# Patient Record
Sex: Female | Born: 1985
Health system: Southern US, Community
[De-identification: ages and names within clinical notes are randomized; demographics above are authoritative.]

## PROBLEM LIST (undated history)

## (undated) ENCOUNTER — Inpatient Hospital Stay (HOSPITAL_COMMUNITY): Payer: Self-pay

## (undated) DIAGNOSIS — D649 Anemia, unspecified: Secondary | ICD-10-CM

## (undated) DIAGNOSIS — O3430 Maternal care for cervical incompetence, unspecified trimester: Secondary | ICD-10-CM

## (undated) HISTORY — PX: NO PAST SURGERIES: SHX2092

---

## 2019-11-17 ENCOUNTER — Other Ambulatory Visit: Payer: Self-pay | Admitting: Family Medicine

## 2019-11-17 ENCOUNTER — Other Ambulatory Visit (HOSPITAL_COMMUNITY)
Admission: RE | Admit: 2019-11-17 | Discharge: 2019-11-17 | Disposition: A | Discharge status: Home / Self Care | Payer: Commercial Managed Care - PPO | Source: Ambulatory Visit | Attending: Family Medicine | Admitting: Family Medicine

## 2019-11-17 DIAGNOSIS — Z0000 Encounter for general adult medical examination without abnormal findings: Secondary | ICD-10-CM | POA: Insufficient documentation

## 2020-08-24 NOTE — L&D Delivery Note (Signed)
Delivery Note At 3:17 PM a 21 weeks 3 days non-viable female was delivered via Vaginal, Spontaneous (Presentation:   Occiput Transverse).  APGAR: 0, 0; weight  .   Placenta status: Spontaneous;Pathology, Intact.  Cord: 3 vessels with the following complications: None.  Cord pH: NA  Anesthesia: Epidural Episiotomy: None Lacerations: None Suture Repair:  NA Est. Blood Loss (mL):  100 MmL  Mom to postpartum.  Baby to [place].  Maria Murray 02/18/2021, 6:11 PM

## 2021-01-01 ENCOUNTER — Other Ambulatory Visit: Payer: Self-pay

## 2021-01-01 ENCOUNTER — Encounter (HOSPITAL_COMMUNITY): Payer: Self-pay | Admitting: Obstetrics and Gynecology

## 2021-01-01 ENCOUNTER — Inpatient Hospital Stay (HOSPITAL_COMMUNITY)
Admission: AD | Admit: 2021-01-01 | Discharge: 2021-01-01 | Disposition: A | Discharge status: Home / Self Care | Payer: Managed Care, Other (non HMO) | Attending: Obstetrics and Gynecology | Admitting: Obstetrics and Gynecology

## 2021-01-01 ENCOUNTER — Inpatient Hospital Stay (HOSPITAL_BASED_OUTPATIENT_CLINIC_OR_DEPARTMENT_OTHER): Payer: Managed Care, Other (non HMO)

## 2021-01-01 DIAGNOSIS — O3412 Maternal care for benign tumor of corpus uteri, second trimester: Secondary | ICD-10-CM | POA: Diagnosis not present

## 2021-01-01 DIAGNOSIS — O26892 Other specified pregnancy related conditions, second trimester: Secondary | ICD-10-CM | POA: Insufficient documentation

## 2021-01-01 DIAGNOSIS — O209 Hemorrhage in early pregnancy, unspecified: Secondary | ICD-10-CM | POA: Insufficient documentation

## 2021-01-01 DIAGNOSIS — O4692 Antepartum hemorrhage, unspecified, second trimester: Secondary | ICD-10-CM

## 2021-01-01 DIAGNOSIS — R1012 Left upper quadrant pain: Secondary | ICD-10-CM | POA: Insufficient documentation

## 2021-01-01 DIAGNOSIS — D251 Intramural leiomyoma of uterus: Secondary | ICD-10-CM | POA: Diagnosis not present

## 2021-01-01 DIAGNOSIS — Z3A14 14 weeks gestation of pregnancy: Secondary | ICD-10-CM | POA: Diagnosis not present

## 2021-01-01 DIAGNOSIS — Z79899 Other long term (current) drug therapy: Secondary | ICD-10-CM | POA: Insufficient documentation

## 2021-01-01 DIAGNOSIS — R0781 Pleurodynia: Secondary | ICD-10-CM | POA: Insufficient documentation

## 2021-01-01 DIAGNOSIS — O418X20 Other specified disorders of amniotic fluid and membranes, second trimester, not applicable or unspecified: Secondary | ICD-10-CM | POA: Insufficient documentation

## 2021-01-01 HISTORY — DX: Anemia, unspecified: D64.9

## 2021-01-01 NOTE — MAU Note (Signed)
Went to [place] around 0215 and had bright blood on tissue when I wiped. No pain now. Had some pain in LUQ last night but went away. Did exercise yesterday and walked. No recent intercourse

## 2021-01-01 NOTE — Discharge Instructions (Signed)
Subchorionic Hematoma  A hematoma is a collection of blood outside of the blood vessels. A subchorionic hematoma is a collection of blood between the outer wall of the embryo (chorion) and the inner wall of the uterus. This condition can cause vaginal bleeding. Early small hematomas usually shrink on their own and do not affect your baby or pregnancy. When bleeding starts later in pregnancy, or if the hematoma is larger or occurs in older pregnant women, the condition may be more serious. Larger hematomas increase the chances of miscarriage. This condition also increases the risk of:  Premature separation of the placenta from the uterus.  Premature (preterm) labor.  Stillbirth. What are the causes? The exact cause of this condition is not known. It occurs when blood is trapped between the placenta and the uterine wall because the placenta has separated from the original site of implantation. What increases the risk? You are more likely to develop this condition if:  You were treated with fertility medicines.  You became pregnant through in vitro fertilization (IVF). What are the signs or symptoms? Symptoms of this condition include:  Vaginal spotting or bleeding.  Abdominal pain. This is rare. Sometimes you may have no symptoms and the bleeding may only be seen when ultrasound images are taken (transvaginal ultrasound). How is this diagnosed? This condition is diagnosed based on a physical exam. This includes a pelvic exam. You may also have other tests, including:  Blood tests.  Urine tests.  Ultrasound of the abdomen. How is this treated? Treatment for this condition can vary. Treatment may include:  Watchful waiting. You will be monitored closely for any changes in bleeding.  Medicines.  Activity restriction. This may be needed until the bleeding stops.  A medicine called Rh immunoglobulin. This is given if you have an Rh-negative blood type. It prevents Rh  sensitization. Follow these instructions at home:  Stay on bed rest if told to do so by your health care provider.  Do not lift anything that is heavier than 10 lb (4.5 kg), or the limit that you are told by your health care provider.  Track and write down the number of pads you use each day and how soaked (saturated) they are.  Do not use tampons.  Keep all follow-up visits. This is important. Your health care provider may ask you to have follow-up blood tests or ultrasound tests or both. Contact a health care provider if:  You have any vaginal bleeding.  You have a fever. Get help right away if:  You have severe cramps in your stomach, back, abdomen, or pelvis.  You pass large clots or tissue. Save any tissue for your health care provider to look at.  You faint.  You become light-headed or weak. Summary  A subchorionic hematoma is a collection of blood between the outer wall of the embryo (chorion) and the inner wall of the uterus.  This condition can cause vaginal bleeding.  Sometimes you may have no symptoms and the bleeding may only be seen when ultrasound images are taken.  Treatment may include watchful waiting, medicines, or activity restriction.  Keep all follow-up visits. Get help right away if you have severe cramps or heavy vaginal bleeding. This information is not intended to replace advice given to you by your health care provider. Make sure you discuss any questions you have with your health care provider. Document Revised: 05/06/2020 Document Reviewed: 05/06/2020 Elsevier Patient Education  2021 [employer].

## 2021-01-01 NOTE — MAU Provider Note (Addendum)
Chief Complaint: Vaginal Bleeding   Event Date/Time   First Provider Initiated Contact with Patient 01/01/21 0339        SUBJECTIVE HPI: Maria Murray is a 35 y.o. G1P0 at [redacted]w[redacted]d by LMP who presents to maternity admissions reporting vaginal bleeding at [place].  No pain associated with this other than a transient pain in LUQ near rib. . She denies vaginal itching/burning, urinary symptoms, h/a, dizziness, n/v, or fever/chills.    Vaginal Bleeding The patient's primary symptoms include vaginal bleeding. The patient's pertinent negatives include no genital itching, genital lesions, genital odor or pelvic pain. This is a new problem. The current episode started today. The problem has been unchanged. The patient is experiencing no pain. She is pregnant. Pertinent negatives include no abdominal pain, back pain, chills, constipation, diarrhea, dysuria, fever, frequency, headaches, nausea or vomiting. The vaginal discharge was bloody. The vaginal bleeding is lighter than menses. She has not been passing clots. She has not been passing tissue. Nothing aggravates the symptoms. She has tried nothing for the symptoms.   RN Note: Went to [facility] around 0215 and had bright blood on tissue when I wiped. No pain now. Had some pain in LUQ last night but went away. Did exercise yesterday and walked. No recent intercourse  Past Medical History:  Diagnosis Date  . Anemia    History reviewed. No pertinent surgical history. Social History   Socioeconomic History  . Marital status: Married    Spouse name: Not on file  . Number of children: Not on file  . Years of education: Not on file  . Highest education level: Not on file  Occupational History  . Not on file  Tobacco Use  . Smoking status: Never Smoker  . Smokeless tobacco: Never Used  Vaping Use  . Vaping Use: Never used  Substance and Sexual Activity  . Alcohol use: Not Currently  . Drug use: Never  . Sexual activity: Yes  Other Topics Concern   . Not on file  Social History Narrative  . Not on file   Social Determinants of Health   Financial Resource Strain: Not on file  Food Insecurity: Not on file  Transportation Needs: Not on file  Physical Activity: Not on file  Stress: Not on file  Social Connections: Not on file  Intimate Partner Violence: Not on file   No current facility-administered medications on file prior to encounter.   Current Outpatient Medications on File Prior to Encounter  Medication Sig Dispense Refill  . cholecalciferol (VITAMIN D3) 25 MCG (1000 UNIT) tablet Take 1,000 Units by mouth daily.    . Prenatal Vit-Fe Fumarate-FA (PRENATAL MULTIVITAMIN) TABS tablet Take 1 tablet by mouth daily at 12 noon.     No Known Allergies  I have reviewed patient's Past Medical Hx, Surgical Hx, Family Hx, Social Hx, medications and allergies.   ROS:  Review of Systems  Constitutional: Negative for chills and fever.  Gastrointestinal: Negative for abdominal pain, constipation, diarrhea, nausea and vomiting.  Genitourinary: Positive for vaginal bleeding. Negative for dysuria, frequency and pelvic pain.  Musculoskeletal: Negative for back pain.  Neurological: Negative for headaches.   Review of Systems  Other systems negative   Physical Exam  Physical Exam Patient Vitals for the past 24 hrs:  BP Temp Pulse Resp SpO2 Height Weight  01/01/21 0316 111/64 -- 68 -- 100 % -- --  01/01/21 0315 -- 98 F (36.7 C) -- 18 -- 5\' 7"  (1.702 m) 74.8 kg   Constitutional:  Well-developed, well-nourished female in no acute distress.  Cardiovascular: normal rate Respiratory: normal effort GI: Abd soft, non-tender. Pos BS x 4 MS: Extremities nontender, no edema, normal ROM Neurologic: Alert and oriented x 4.  GU: Neg CVAT.  PELVIC EXAM: Cervix pink, visually closed, without lesion, small dark red somewhat clotted bloody discharge, vaginal walls and external genitalia normal  FHT 147 by doppler  LAB RESULTS Blood Type is  O+    IMAGING Posterior placenta ? Small subchorionic hemorrhage noted 1 myoma, 2cm  MAU Management/MDM: Ordered Ultrasound to investigate source of bleeding US showed one small ? [facility]  Reviewed findings with patient in depth Pictures shown.  Reassured [facility] is not statistically associated with miscarriage Recommend pelvic rest for a couple of weeks  ASSESSMENT Single iUP at [redacted]w[redacted]d Second trimester bleeding ? Small [place]  PLAN Discharge home Pelvic rest Bleeding precautions Follow up in office Pt stable at time of discharge. Encouraged to return here if she develops worsening of symptoms, increase in pain, fever, or other concerning symptoms.    Hansel Feinstein CNM, MSN Certified Nurse-Midwife 01/01/2021  3:39 AM

## 2021-01-01 NOTE — Progress Notes (Addendum)
Written and verbal d/c instructions given and understanding voiced. Showed pt how to sign up for MyChart using the code on her d/c papers.

## 2021-02-10 ENCOUNTER — Inpatient Hospital Stay (HOSPITAL_COMMUNITY)
Admission: AD | Admit: 2021-02-10 | Discharge: 2021-02-19 | DRG: 805 | Disposition: A | Discharge status: Home / Self Care | Payer: Managed Care, Other (non HMO) | Attending: Obstetrics and Gynecology | Admitting: Obstetrics and Gynecology

## 2021-02-10 ENCOUNTER — Encounter (HOSPITAL_COMMUNITY): Payer: Self-pay | Admitting: Obstetrics and Gynecology

## 2021-02-10 ENCOUNTER — Other Ambulatory Visit: Payer: Self-pay

## 2021-02-10 DIAGNOSIS — O3432 Maternal care for cervical incompetence, second trimester: Secondary | ICD-10-CM | POA: Diagnosis present

## 2021-02-10 DIAGNOSIS — Z3A20 20 weeks gestation of pregnancy: Secondary | ICD-10-CM | POA: Diagnosis not present

## 2021-02-10 DIAGNOSIS — Z3A21 21 weeks gestation of pregnancy: Secondary | ICD-10-CM | POA: Diagnosis not present

## 2021-02-10 DIAGNOSIS — Z20822 Contact with and (suspected) exposure to covid-19: Secondary | ICD-10-CM | POA: Diagnosis present

## 2021-02-10 DIAGNOSIS — O42912 Preterm premature rupture of membranes, unspecified as to length of time between rupture and onset of labor, second trimester: Secondary | ICD-10-CM | POA: Diagnosis not present

## 2021-02-10 DIAGNOSIS — O321XX0 Maternal care for breech presentation, not applicable or unspecified: Secondary | ICD-10-CM | POA: Diagnosis not present

## 2021-02-10 DIAGNOSIS — O209 Hemorrhage in early pregnancy, unspecified: Secondary | ICD-10-CM

## 2021-02-10 DIAGNOSIS — O3433 Maternal care for cervical incompetence, third trimester: Secondary | ICD-10-CM | POA: Diagnosis present

## 2021-02-10 DIAGNOSIS — O09522 Supervision of elderly multigravida, second trimester: Secondary | ICD-10-CM | POA: Diagnosis not present

## 2021-02-10 DIAGNOSIS — N883 Incompetence of cervix uteri: Secondary | ICD-10-CM | POA: Diagnosis present

## 2021-02-10 DIAGNOSIS — O26892 Other specified pregnancy related conditions, second trimester: Secondary | ICD-10-CM | POA: Diagnosis not present

## 2021-02-10 DIAGNOSIS — O4692 Antepartum hemorrhage, unspecified, second trimester: Secondary | ICD-10-CM | POA: Diagnosis not present

## 2021-02-10 DIAGNOSIS — O3442 Maternal care for other abnormalities of cervix, second trimester: Secondary | ICD-10-CM | POA: Diagnosis not present

## 2021-02-10 DIAGNOSIS — Z3492 Encounter for supervision of normal pregnancy, unspecified, second trimester: Secondary | ICD-10-CM

## 2021-02-10 DIAGNOSIS — O4290 Premature rupture of membranes, unspecified as to length of time between rupture and onset of labor, unspecified weeks of gestation: Secondary | ICD-10-CM

## 2021-02-10 DIAGNOSIS — N898 Other specified noninflammatory disorders of vagina: Secondary | ICD-10-CM | POA: Diagnosis not present

## 2021-02-10 DIAGNOSIS — O3431 Maternal care for cervical incompetence, first trimester: Secondary | ICD-10-CM | POA: Diagnosis present

## 2021-02-10 LAB — CBC
HCT: 39 % (ref 36.0–46.0)
Hemoglobin: 13.1 g/dL (ref 12.0–15.0)
MCH: 29.6 pg (ref 26.0–34.0)
MCHC: 33.6 g/dL (ref 30.0–36.0)
MCV: 88.2 fL (ref 80.0–100.0)
Platelets: 204 10*3/uL (ref 150–400)
RBC: 4.42 MIL/uL (ref 3.87–5.11)
RDW: 13.1 % (ref 11.5–15.5)
WBC: 12.9 10*3/uL — ABNORMAL HIGH (ref 4.0–10.5)
nRBC: 0 % (ref 0.0–0.2)

## 2021-02-10 LAB — TYPE AND SCREEN
ABO/RH(D): O POS
Antibody Screen: NEGATIVE

## 2021-02-10 LAB — SARS CORONAVIRUS 2 (TAT 6-24 HRS): SARS Coronavirus 2: NEGATIVE

## 2021-02-10 LAB — URINALYSIS, ROUTINE W REFLEX MICROSCOPIC
Bilirubin Urine: NEGATIVE
Glucose, UA: NEGATIVE mg/dL
Ketones, ur: 20 mg/dL — AB
Nitrite: NEGATIVE
Protein, ur: NEGATIVE mg/dL
Specific Gravity, Urine: 1.003 — ABNORMAL LOW (ref 1.005–1.030)
pH: 7 (ref 5.0–8.0)

## 2021-02-10 MED ORDER — SODIUM CHLORIDE 0.9% FLUSH: 3.0000 mL | INTRAVENOUS | Status: DC | PRN

## 2021-02-10 MED ORDER — SODIUM CHLORIDE 0.9% FLUSH
3.0000 mL | Freq: Two times a day (BID) | INTRAVENOUS | Status: DC
  Administered 2021-02-10 – 2021-02-17 (×13): 3 mL via INTRAVENOUS

## 2021-02-10 MED ORDER — PRENATAL MULTIVITAMIN CH
1.0000 | ORAL_TABLET | Freq: Every day | ORAL | Status: DC
  Administered 2021-02-11 – 2021-02-18 (×8): 1 via ORAL
  Filled 2021-02-10 (×9): qty 1

## 2021-02-10 MED ORDER — SODIUM CHLORIDE 0.9 % IV SOLN: 250.0000 mL | INTRAVENOUS | Status: DC | PRN

## 2021-02-10 MED ORDER — ZOLPIDEM TARTRATE 5 MG PO TABS: 5.0000 mg | ORAL_TABLET | Freq: Every evening | ORAL | Status: DC | PRN

## 2021-02-10 MED ORDER — CALCIUM CARBONATE ANTACID 500 MG PO CHEW
2.0000 | CHEWABLE_TABLET | ORAL | Status: DC | PRN
  Administered 2021-02-17: 400 mg via ORAL
  Filled 2021-02-10: qty 2

## 2021-02-10 MED ORDER — DOCUSATE SODIUM 100 MG PO CAPS
100.0000 mg | ORAL_CAPSULE | Freq: Every day | ORAL | Status: DC
  Administered 2021-02-11 – 2021-02-18 (×8): 100 mg via ORAL
  Filled 2021-02-10 (×8): qty 1

## 2021-02-10 MED ORDER — ACETAMINOPHEN 325 MG PO TABS
650.0000 mg | ORAL_TABLET | ORAL | Status: DC | PRN
  Filled 2021-02-10: qty 2

## 2021-02-10 NOTE — Plan of Care (Signed)
  Problem: Education: Goal: Knowledge of General Education information will improve Description: Including pain rating scale, medication(s)/side effects and non-pharmacologic comfort measures Outcome: Completed/Met

## 2021-02-10 NOTE — H&P (Signed)
Maria Murray is a 35 y.o. female  G1P0 at 20 weeks and 2 days. presenting for admission due to cervical insufficiency.  Patient was seen in the office today for an ultrasound to follow up anatomy. She was seen 06/06 for roa and her cervix was 4 cm at that time. Today 02/10/2021 the cervix is dilated. Internal os is 0.8 cm the external os is 3.0 cm the tv cervical length is approximately 3.0 cm. The Fetal anatomy appears normal female fetus. Cephalic presentation. 327 grams 31%ile. Placenta is located posteriorly.   OB History     Gravida  1   Para      Term      Preterm      AB      Living         SAB      IAB      Ectopic      Multiple      Live Births             Past Medical History:  Diagnosis Date   Anemia    Past Surgical History:  Procedure Laterality Date   NO PAST SURGERIES     Family History: family history is not on file. Social History:  reports that she has never smoked. She has never used smokeless tobacco. She reports previous alcohol use. She reports that she does not use drugs.    Review of Systems  Constitutional: Negative.   HENT: Negative.    Eyes: Negative.   Respiratory: Negative.    Cardiovascular: Negative.   Gastrointestinal: Negative.   Endocrine: Negative.   Genitourinary: Negative.   Musculoskeletal: Negative.   Skin: Negative.   Allergic/Immunologic: Negative.   Neurological: Negative.   Hematological: Negative.   Psychiatric/Behavioral: Negative.    History   Blood pressure 112/62, pulse (!) 56, temperature 97.6 F (36.4 C), temperature source Oral, resp. rate 16, height 5\' 7"  (1.702 m), weight 78 kg, last menstrual period 09/19/2020, SpO2 100 %. Maternal Exam:  Introitus: Normal vulva.  Physical Exam Vitals reviewed.  Constitutional:      Appearance: Normal appearance.  HENT:     Head: Normocephalic and atraumatic.     Nose: Nose normal.  Eyes:     Pupils: Pupils are equal, round, and reactive to light.   Cardiovascular:     Rate and Rhythm: Normal rate and regular rhythm.     Pulses: Normal pulses.     Heart sounds: Normal heart sounds.  Pulmonary:     Effort: Pulmonary effort is normal.     Breath sounds: Normal breath sounds.  Genitourinary:    General: Normal vulva.     Comments: Speculum exam performed. And membranes are seen funneling through the external os.  Musculoskeletal:        General: No swelling. Normal range of motion.     Cervical back: Normal range of motion.  Skin:    General: Skin is warm and dry.  Neurological:     General: No focal deficit present.     Mental Status: She is alert and oriented to person, place, and time.  Psychiatric:        Mood and Affect: Mood normal.        Behavior: Behavior normal.  admi  Prenatal labs: ABO, Rh:  O positive  Antibody:  Negative  Rubella:  Immune  RPR:   Nonreactive  HBsAg:   Negative HIV:   Negative  GBS:   unknown  Assessment/Plan: 20 Weeks  and 2 days with cervical insufficiency  - Admit to Ob speciality care bed rest  - trendelenburg position  - MFM Dr. Gertie Exon and Dr. Donalee Citrin contacted. Pt is a candidate for rescue cerclage.  - D/W patient r/o cerclage including but not limited to infection/ bleeding/ possible rupture of membranes/ preterm delivery prior to viability. Pt voiced understanding and desires to proceed with rescue cerclage placement.  - Rescue cerclage placement is scheduled for tomorrow 02/11/2021 Dr. Tama High to assist    Christophe Louis 02/10/2021, 6:01 PM

## 2021-02-11 ENCOUNTER — Encounter (HOSPITAL_COMMUNITY): Payer: Self-pay | Admitting: Anesthesiology

## 2021-02-11 ENCOUNTER — Inpatient Hospital Stay (HOSPITAL_COMMUNITY)
Admission: RE | Admit: 2021-02-11 | Payer: Managed Care, Other (non HMO) | Source: Home / Self Care | Admitting: Obstetrics and Gynecology

## 2021-02-11 ENCOUNTER — Encounter (HOSPITAL_COMMUNITY)
Admission: AD | Disposition: A | Discharge status: Home / Self Care | Payer: Self-pay | Source: Home / Self Care | Attending: Obstetrics and Gynecology

## 2021-02-11 ENCOUNTER — Inpatient Hospital Stay (HOSPITAL_BASED_OUTPATIENT_CLINIC_OR_DEPARTMENT_OTHER): Payer: Managed Care, Other (non HMO)

## 2021-02-11 ENCOUNTER — Encounter (HOSPITAL_COMMUNITY): Payer: Self-pay | Admitting: Obstetrics and Gynecology

## 2021-02-11 DIAGNOSIS — O3442 Maternal care for other abnormalities of cervix, second trimester: Secondary | ICD-10-CM

## 2021-02-11 DIAGNOSIS — O321XX0 Maternal care for breech presentation, not applicable or unspecified: Secondary | ICD-10-CM | POA: Diagnosis not present

## 2021-02-11 DIAGNOSIS — N898 Other specified noninflammatory disorders of vagina: Secondary | ICD-10-CM | POA: Diagnosis not present

## 2021-02-11 DIAGNOSIS — O09522 Supervision of elderly multigravida, second trimester: Secondary | ICD-10-CM

## 2021-02-11 DIAGNOSIS — O3432 Maternal care for cervical incompetence, second trimester: Principal | ICD-10-CM

## 2021-02-11 DIAGNOSIS — O26892 Other specified pregnancy related conditions, second trimester: Secondary | ICD-10-CM | POA: Diagnosis not present

## 2021-02-11 DIAGNOSIS — Z3A20 20 weeks gestation of pregnancy: Secondary | ICD-10-CM

## 2021-02-11 SURGERY — CERCLAGE, CERVIX, VAGINAL APPROACH
Anesthesia: Choice

## 2021-02-11 MED ORDER — AZITHROMYCIN 250 MG PO TABS
1000.0000 mg | ORAL_TABLET | Freq: Once | ORAL | Status: AC
  Administered 2021-02-11: 1000 mg via ORAL
  Filled 2021-02-11: qty 4

## 2021-02-11 MED ORDER — INDOMETHACIN 50 MG RE SUPP
50.0000 mg | Freq: Once | RECTAL | Status: AC
  Administered 2021-02-11: 50 mg via RECTAL
  Filled 2021-02-11: qty 1

## 2021-02-11 MED ORDER — LACTATED RINGERS IV SOLN: INTRAVENOUS | Status: DC

## 2021-02-11 MED ORDER — SODIUM CHLORIDE 0.9 % IV SOLN
3.0000 g | Freq: Four times a day (QID) | INTRAVENOUS | Status: DC
  Administered 2021-02-11: 3 g via INTRAVENOUS
  Filled 2021-02-11: qty 8

## 2021-02-11 NOTE — Consult Note (Signed)
Maternal-Fetal Medicine  Name: Caty Tessler MRN: [id number] DOB: 1985/12/19 Referring Provider: Christophe Louis, MD  I had the pleasure of seeing Ms. Lore who was admitted yesterday at [facility] specialty care.  Our initial plan was to perform rescue cerclage after Dr. Landry Mellow informed me of ultrasound and clinical findings of cervical incompetence.  Patient was asymptomatic before cervical insufficiency was diagnosed on ultrasound performed yesterday at Dr. Sundra Aland office.  She had mild pelvic pressure and frequency of urination.  No history of vaginal bleeding before admission.  She does not have any chronic medical conditions.  Advanced maternal age.  On cell free fetal DNA screening, she had low risk for fetal aneuploidies. Today morning, she had vaginal bleeding with no blood clots.  P/E: Patient is comfortably lying in bed; not in distress. BP 112/60 mmHg; afebrile. Abdomen: Soft, no tenderness. After explaining, we performed a sterile speculum examination.  The membranes were bulging far into the vagina.  I performed digital examination.  The cervix is at least 4 to 5 cm dilated and the membranes are slightly more than halfway into the vagina. Labs: WBC 12.9, hemoglobin 13.1, hematocrit 39, platelets 204.  Blood type O positive.  I counseled the couple on the following: Cervical insufficiency with advanced cervical dilation -I explained the findings and the diagnosis of cervical insufficiency with help of diagrams. -The membranes are prolapsing into the vagina and the cervix is at least 4 to 5 cm dilated. -Given that she has advanced dilation, I do not recommend rescue cerclage. -Rescue cerclage is likely to increase the risk of rupture of membranes during the procedure. -Cerclage is also associated with increased risk of maternal infection and hemorrhage. -I discussed the options including expectant management or termination of pregnancy.  I informed the couple that they do not have to make the  decision today and should take adequate time to come to a decision. -If expectant management is chosen, she will be discharged tomorrow and readmitted at [redacted] weeks gestation till delivery.  Patient was made to understand that she has a very high likelihood of spontaneous rupture membranes and/or miscarriage. -If delivery occurs at 23 or 24 weeks, it can lead to extreme prematurity in the newborn that can be associated with increased risk of poor neurodevelopmental outcomes and cerebral palsy. -I briefly discussed about future pregnancies and that I recommend prophylactic cerclage to be placed between 12- and 14-weeks' gestation.  Dr. Landry Mellow was present during my counseling.  Thank you for consultation. If you have any questions, please contact me at the [facility]. Consultation including face-to-face counseling 50 minutes.

## 2021-02-11 NOTE — Progress Notes (Signed)
Maria Murray is a 35 y.o. female  G1P0 at 20 weeks and 3 days admitted with cervical insufficiency.   Subjective. Pt reports bright red bleeding the am mixed with mucus. It has subsided. She has intermittent cramping .Marland Kitchen no LOF. +FM  Vitals:   02/11/21 0802 02/11/21 1156  BP: 110/64 112/60  Pulse: 76 67  Resp: 17 17  Temp: 98.4 F (36.9 C) 98.7 F (37.1 C)  SpO2: 100% 100%   General: Alert and Oriented NAD  pt in trendelenburg position  Resp: no distress  Abdomen gravid and non tender  Ext: no edema SCD's in place  Speculum exam. Membranes noted 1/2 way into the vagina. No visible cervix  Digital exam by Dr. Donalee Citrin pt is at least 4 cm dilated.  Results for orders placed or performed during the hospital encounter of 02/10/21 (from the past 24 hour(s))  Type and screen [place]     Status: None   Collection Time: 02/10/21  6:35 PM  Result Value Ref Range   ABO/RH(D) O POS    Antibody Screen NEG    Sample Expiration      02/13/2021,2359 Performed at [facility] [address], [place], [place] [address]   CBC on admission     Status: Abnormal   Collection Time: 02/10/21  6:35 PM  Result Value Ref Range   WBC 12.9 (H) 4.0 - 10.5 K/uL   RBC 4.42 3.87 - 5.11 MIL/uL   Hemoglobin 13.1 12.0 - 15.0 g/dL   HCT 39.0 36.0 - 46.0 %   MCV 88.2 80.0 - 100.0 fL   MCH 29.6 26.0 - 34.0 pg   MCHC 33.6 30.0 - 36.0 g/dL   RDW 13.1 11.5 - 15.5 %   Platelets 204 150 - 400 K/uL   nRBC 0.0 0.0 - 0.2 %  SARS CORONAVIRUS 2 (TAT 6-24 HRS) Nasopharyngeal Nasopharyngeal Swab     Status: None   Collection Time: 02/10/21  6:37 PM   Specimen: Nasopharyngeal Swab  Result Value Ref Range   SARS Coronavirus 2 NEGATIVE NEGATIVE  Urinalysis, Routine w reflex microscopic     Status: Abnormal   Collection Time: 02/10/21  7:45 PM  Result Value Ref Range   Color, Urine STRAW (A) YELLOW   APPearance HAZY (A) CLEAR   Specific Gravity, Urine 1.003 (L) 1.005 - 1.030   pH 7.0 5.0  - 8.0   Glucose, UA NEGATIVE NEGATIVE mg/dL   Hgb urine dipstick MODERATE (A) NEGATIVE   Bilirubin Urine NEGATIVE NEGATIVE   Ketones, ur 20 (A) NEGATIVE mg/dL   Protein, ur NEGATIVE NEGATIVE mg/dL   Nitrite NEGATIVE NEGATIVE   Leukocytes,Ua MODERATE (A) NEGATIVE   RBC / HPF 0-5 0 - 5 RBC/hpf   WBC, UA 11-20 0 - 5 WBC/hpf   Bacteria, UA RARE (A) NONE SEEN   Squamous Epithelial / LPF 0-5 0 - 5    Ultrasound this am shows breech position . Cervix external os is dilated. See full report   A/P 20 weeks and 3 days with cervical insufficiency / incompetence.  - Pt is not a candidate for rescue cerclage based on advanced dilatation. And further advancement of membranes into the vaginal vault. This determination was made by Dr. Donalee Citrin.   - Options of management discussed with patient by Dr. Donalee Citrin include 1) expectant management at home until 23 weeks. Vs. 2) proceeding with termination due to maternal indications and increased r/o chorioamnionitis.   The patient and her husband  would like to consider options overnight . Will manage in house for 24 hours .If they opt for expectant management will discharge home in AM   - Advance to regular diet  - Bedrest with bathroom privileges.  - Saline lock IV   CCOB covering after 7 pm this evening.

## 2021-02-11 NOTE — Progress Notes (Signed)
Patient called out that she was bleeding. RN to 116, patient on the toilet with small amt of bright red blood noted on panties (4-5 cm area) and wiping several times with some mucous/bright red blood on paper. Patient stating that she feels like she needs to have a BM, allowed to void, then returned to bed. FHR 145 on the lower abd area, at the patients hairline. No active bleeding noted when patient returned to bed. Patient returned to Trendelenburg. Reported to Dr. Landry Mellow and orders received for transvaginal OB u/s.

## 2021-02-12 ENCOUNTER — Inpatient Hospital Stay (HOSPITAL_BASED_OUTPATIENT_CLINIC_OR_DEPARTMENT_OTHER): Payer: Managed Care, Other (non HMO)

## 2021-02-12 DIAGNOSIS — O3442 Maternal care for other abnormalities of cervix, second trimester: Secondary | ICD-10-CM | POA: Diagnosis not present

## 2021-02-12 DIAGNOSIS — O368320 Maternal care for abnormalities of the fetal heart rate or rhythm, second trimester, not applicable or unspecified: Secondary | ICD-10-CM

## 2021-02-12 DIAGNOSIS — O26892 Other specified pregnancy related conditions, second trimester: Secondary | ICD-10-CM

## 2021-02-12 DIAGNOSIS — Z3A20 20 weeks gestation of pregnancy: Secondary | ICD-10-CM

## 2021-02-12 DIAGNOSIS — O42912 Preterm premature rupture of membranes, unspecified as to length of time between rupture and onset of labor, second trimester: Secondary | ICD-10-CM

## 2021-02-12 DIAGNOSIS — N898 Other specified noninflammatory disorders of vagina: Secondary | ICD-10-CM | POA: Diagnosis not present

## 2021-02-12 LAB — URINE CULTURE: Culture: <10000 — AB

## 2021-02-12 LAB — AMNISURE RUPTURE OF MEMBRANE (ROM) NOT AT ARMC: Amnisure ROM: POSITIVE

## 2021-02-12 NOTE — Progress Notes (Signed)
Maria Murray is a 35 y.o. female  G1P0 at 20 weeks and 4 days admitted with cervical insufficiency.  Subjective. In to see patient due to report of SROM this AM . Amniosure was positive. Pt denies abdominal pain. She reports occasional cramping.   Vitals:   02/12/21 0749 02/12/21 1153  BP: 117/63 102/62  Pulse: 70 67  Resp: 18 18  Temp: 98.3 F (36.8 C) 98.6 F (37 C)  SpO2: 98% 98%    General: Alert and Oriented NAD  pt in trendelenburg position Resp: no distress Abdomen gravid and non tender Ext: no edema SCD's in place Speculum exam.   Again  Membranes noted 1/2 way into the vagina. No visible cervix. Most likely High membrane leak  Digital exam deferred.   FHR by doppler 140's  Results for orders placed or performed during the hospital encounter of 02/10/21 (from the past 24 hour(s))  Amnisure rupture of membrane (rom)not at [facility]     Status: None   Collection Time: 02/12/21 11:47 AM  Result Value Ref Range   Amnisure ROM POSITIVE    Wet mount in office 06/20 was negative.   A/P 20 weeks and 4 days with cervical insufficiency advanced cervical dilation now with PPROM - discussed with patient increased r/o infection now that membranes are ruptured.  - Management options remain the same expectant management at home vs termination due to r/o chorio.  - Pt is unsure how she would like to proceed. Will monitor overnight in patient for signs of labor and chorio. If stable in AM and pt still desires expectant management plan d/c home. If she desires termination plan cytotec 400 mcg every 4 hours as needed.  -gbs culture pending  - gc/chl pending/   Dr. Delora Fuel covering after 5 pm this evening until 7pm on 02/13/2021

## 2021-02-12 NOTE — Progress Notes (Signed)
Maria Murray is a 35 y.o. female  G1P0 at 20 weeks and 4 days admitted with cervical insufficiency.  Subjective: Pt reports some spotting no contractions. + FM. She has decided expectant management instead of termination. She has concerns with going home.   Vitals:   02/12/21 0554 02/12/21 0749  BP: (!) 106/59 117/63  Pulse: 68 70  Resp: 16 18  Temp: 99 F (37.2 C) 98.3 F (36.8 C)  SpO2: 98% 98%     General: Alert and Oriented NAD  pt in trendelenburg position Resp: no distress Abdomen gravid and non tender Ext: no edema SCD's in place Speculum exam.  On 02/11/21 Membranes noted 1/2 way into the vagina. No visible cervix Digital exam by Dr. Donalee Citrin on 02/11/21 pt is at least 4 cm dilated.  Toco: no contractions noted.   20 weeks and 4 days with Cervical insufficiency with advanced cervical dilation   Patient has opted for expectant management instead of termination  - discharge home today on bedrest with bathroom privileges  - pt advised to return to hospital with heavy vaginal bleeding/ regular contractions/ rupture of membranes/ abdominal pain / temperature greater tha 100.4  - plan to readmit at 22 weeks and 6 days for steroid administration for fetal lung maturity if no signs of chorio develop.  - plan visit in the office in 2 weeks sooner if needed.

## 2021-02-12 NOTE — Progress Notes (Signed)
Initial visit with Ivin Booty and Merrilee Seashore to introduce spiritual care and offer support as they await the results of their amnisure test. Maria Murray was in her hospital bed with her feet elevated and Merrilee Seashore at her side, both pt and her spouse had tears in their eyes. Merrilee Seashore shared that before [employer] broke, she had been told that they could either terminate the pregnancy or go home and try to remain pregnant until viability at 23 weeks. Waynetta shared that they just learned that the fluid she was leaking was amniotic fluid and they are currently waiting for Dr Landry Mellow to come speak with them about options and plans. Chaplain asked open ended questions to facilitate story telling and emotional expression. Both parents stated that they had longed to become parents for a long time. Teren shared that her pregnancy has been typical so far, until Monday when she had an ultrasound and learned that her cervix was dilated and things have been frightening since then. She worries that there is something she could have done to prevent this from happening or if she'd been able to have a cerclage right away on Monday if she could be in a better place. Chaplain normalized the inclination to blame oneself and encouraged pt to reflect on her strengths including the self advocacy she displayed yesterday with her medical team. Chaplain explored sources of hope and comfort and Leialoha tearfully stated that she is just trying to trust God in this process.   Spiritual care continues to be available as family decides next steps for their son.  Please page as further needs arise.  Donald Prose. Elyn Peers, M.Div. [facility] Chaplain Pager [phone] Office [phone]

## 2021-02-12 NOTE — Discharge Summary (Signed)
Physician Discharge Summary  Patient ID: Maria Murray MRN: [id number] DOB/AGE: September 01, 1985 35 y.o.  Admit date: 02/10/2021 Discharge date: 02/12/2021  Admission Diagnoses: Cervical insufficiency in second trimester   Discharge Diagnoses: Same    Discharged Condition: stable  Hospital Course:  pt was admitted with cervical insufficiency  and hour glassing membranes. She was originally scheduled for rescue cerclage.  She was placed on bedrest and in the trendelenburg position. She began having bloody show on 02/11/2021 .  Speculum exam revealed further prolapse of membranes into the vagina and cervix at least 4 cm dilated. Her evaluation was done by Dr. Tama High with maternal fetal medicine. Her rescue cerclage was cancelled due to advanced cervical dilation. She was given option of termination due to maternal risk of chorio vs expectant management at home. She has opted for expectant management.   Consults:  Maternal fetal medicine   Significant Diagnostic Studies: labs:  Results for orders placed or performed during the hospital encounter of 02/10/21 (from the past 48 hour(s))  Type and screen [place]     Status: None   Collection Time: 02/10/21  6:35 PM  Result Value Ref Range   ABO/RH(D) O POS    Antibody Screen NEG    Sample Expiration      02/13/2021,2359 Performed at [facility] [address], [place], [place] [address]   CBC on admission     Status: Abnormal   Collection Time: 02/10/21  6:35 PM  Result Value Ref Range   WBC 12.9 (H) 4.0 - 10.5 K/uL   RBC 4.42 3.87 - 5.11 MIL/uL   Hemoglobin 13.1 12.0 - 15.0 g/dL   HCT 39.0 36.0 - 46.0 %   MCV 88.2 80.0 - 100.0 fL   MCH 29.6 26.0 - 34.0 pg   MCHC 33.6 30.0 - 36.0 g/dL   RDW 13.1 11.5 - 15.5 %   Platelets 204 150 - 400 K/uL   nRBC 0.0 0.0 - 0.2 %    Comment: Performed at [facility], [place] [address] [place], [place] [address]  SARS CORONAVIRUS 2 (TAT 6-24 HRS) Nasopharyngeal  Nasopharyngeal Swab     Status: None   Collection Time: 02/10/21  6:37 PM   Specimen: Nasopharyngeal Swab  Result Value Ref Range   SARS Coronavirus 2 NEGATIVE NEGATIVE    Comment: (NOTE) SARS-CoV-2 target nucleic acids are NOT DETECTED.  The SARS-CoV-2 RNA is generally detectable in upper and lower respiratory specimens during the acute phase of infection. Negative results do not preclude SARS-CoV-2 infection, do not rule out co-infections with other pathogens, and should not be used as the sole basis for treatment or other patient management decisions. Negative results must be combined with clinical observations, patient history, and epidemiological information. The expected result is Negative.  Fact Sheet for Patients: SugarRoll.be  Fact Sheet for Healthcare Providers: https://www.woods-mathews.com/  This test is not yet approved or cleared by the [place] FDA and  has been authorized for detection and/or diagnosis of SARS-CoV-2 by FDA under an Emergency Use Authorization (EUA). This EUA will remain  in effect (meaning this test can be used) for the duration of the COVID-19 declaration under Se ction 564(b)(1) of the Act, 21 U.S.C. section 360bbb-3(b)(1), unless the authorization is terminated or revoked sooner.  Performed at [facility], [place] [address], [place], [place] [address]   Urinalysis, Routine w reflex microscopic     Status: Abnormal   Collection Time: 02/10/21  7:45  PM  Result Value Ref Range   Color, Urine STRAW (A) YELLOW   APPearance HAZY (A) CLEAR   Specific Gravity, Urine 1.003 (L) 1.005 - 1.030   pH 7.0 5.0 - 8.0   Glucose, UA NEGATIVE NEGATIVE mg/dL   Hgb urine dipstick MODERATE (A) NEGATIVE   Bilirubin Urine NEGATIVE NEGATIVE   Ketones, ur 20 (A) NEGATIVE mg/dL   Protein, ur NEGATIVE NEGATIVE mg/dL   Nitrite NEGATIVE NEGATIVE   Leukocytes,Ua MODERATE (A) NEGATIVE   RBC / HPF 0-5 0 - 5 RBC/hpf    WBC, UA 11-20 0 - 5 WBC/hpf   Bacteria, UA RARE (A) NONE SEEN   Squamous Epithelial / LPF 0-5 0 - 5    Comment: Performed at [facility] [address] [place], [place] [address]  Urine culture     Status: Abnormal   Collection Time: 02/10/21  7:45 PM   Specimen: Urine, Clean Catch  Result Value Ref Range   Specimen Description URINE, CLEAN CATCH    Special Requests NONE    Culture (A)     <10,000 COLONIES/mL INSIGNIFICANT GROWTH Performed at [facility] [address] [place], [place] [address]    Report Status 02/12/2021 FINAL      Treatments:  bedrest and IV hydration    Disposition: Discharge disposition: 01-Home or Self Care       Discharge Instructions     Discharge activity: Bedrest   Complete by: As directed    Discharge diet:  No restrictions   Complete by: As directed    Do not have sex or do anything that might make you have an orgasm   Complete by: As directed    Fetal Kick Count:  Lie on our left side for one hour after a meal, and count the number of times your baby kicks.  If it is less than 5 times, get up, move around and drink some juice.  Repeat the test 30 minutes later.  If it is still less than 5 kicks in an hour, notify your doctor.   Complete by: As directed    Notify physician for a general feeling that "something is not right"   Complete by: As directed    Notify physician for increase or change in vaginal discharge   Complete by: As directed    Notify physician for intestinal cramps, with or without diarrhea, sometimes described as "gas pain"   Complete by: As directed    Notify physician for leaking of fluid   Complete by: As directed    Notify physician for low, dull backache, unrelieved by heat or Tylenol   Complete by: As directed    Notify physician for menstrual like cramps   Complete by: As directed    Notify physician for pelvic pressure   Complete by: As directed    Notify physician for uterine contractions.   These may be painless and feel like the uterus is tightening or the baby is  "balling up"   Complete by: As directed    Notify physician for vaginal bleeding   Complete by: As directed    PRETERM LABOR:  Includes any of the follwing symptoms that occur between 20 - [redacted] weeks gestation.  If these symptoms are not stopped, preterm labor can result in preterm delivery, placing your baby at risk   Complete by: As directed       Allergies as of 02/12/2021   No Known Allergies  Medication List     TAKE these medications    cholecalciferol 25 MCG (1000 UNIT) tablet Commonly known as: VITAMIN D3 Take 1,000 Units by mouth daily.   ferrous sulfate 325 (65 FE) MG tablet Take 325 mg by mouth 2 (two) times a week.   prenatal multivitamin Tabs tablet Take 1 tablet by mouth daily at 12 noon.   Probiotic Daily Caps Take 1 capsule by mouth daily.        Follow-up Information     Christophe Louis, MD Follow up in 2 week(s).   Specialty: Obstetrics and Gynecology Why: please call to schedule an appointment with Dr. Junious Dresser information: [address] [place] [address] [phone]                 Signed: Christophe Louis 02/12/2021, 10:23 AM

## 2021-02-12 NOTE — Progress Notes (Signed)
Received initial call from primary RN regarding patient's desire to assess amniotic fluid level via ultrasound. Discussed with primary RN that this would not change management. We discussed that amniotic fluid levels can be normal or abnormal when membranes are ruptured. Made plan to discuss further with patient in the AM.  Received another call from primary RN regarding fetal heart rate. States the heart rate fluctuated from 90-200s but settled out to be 150s-160s. The patient was concerned about the significance of the fluctuating heart rate. Using Vocera, had discussion with patient that the fluctuating heart rate could be due to fetal distress or potential normal fluctuations. Patient states she has read online that when a fetus has an elevated heart rate that this could indicate infection. She requests an ultrasound to assess the fetal heart rate and amniotic fluid so that they can make an informed decision on how to proceed. She asks if the ultrasound will provide an answer regarding fetal infection. We discussed that although fetal tachycardia can be an indication of fetal infection, typically other factors are also used to determine fetal infection and maternal infection. Patient requests for an ultrasound and discussion of results. We discussed ordering ultrasound tonight but official read would not be back until tomorrow once MFM in office. Patient verbalized understanding and agrees.  Repeat US ordered. Will discuss with patient in the morning.  Drema Dallas, DO

## 2021-02-12 NOTE — Progress Notes (Signed)
Patient called out that she "wet the bed". RN to room, patient in the bathroom with help from spouse. Bed chux noted a large clear wet spot. Patient assisted to bed and denies pressure, but stated she thought she "felt something". Inspection of the vaginal area, no fetal parts, BBOW noted.

## 2021-02-13 LAB — TYPE AND SCREEN
ABO/RH(D): O POS
Antibody Screen: NEGATIVE

## 2021-02-13 LAB — GC/CHLAMYDIA PROBE AMP (~~LOC~~) NOT AT ARMC
Chlamydia: NEGATIVE
Comment: NEGATIVE
Comment: NORMAL
Neisseria Gonorrhea: NEGATIVE

## 2021-02-13 NOTE — Progress Notes (Signed)
Maternal-Fetal Medicine (Follow-up Telephone Consultation)  Maria Murray, G1 P0 at 20w 5d gestation with cervical insufficiency and advanced cervical dilation was admitted on 02/10/21 for rescue cerclage. At admission, the cervix was 3 cm dilated (office finding). I examined the patient 2 days ago and the cervix was 4 to 5 cm dilated with membranes half-way in the vagina.  I advised against rescue cerclage after examination. The couple wanted to take time to decide on continuation of pregnancy. Yesterday, she c/o leakage of amniotic fluid. On examination by Dr. Landry Mellow no clear evidence of PPROM was seen.  Patient had ultrasound yesterday. A limited ultrasound study was performed. Amniotic fluid is normal and good fetal heart rate is seen. Amniotic membrane is seen far into the vagina. Transvaginal ultrasound was not performed.  Vitals from her chart: BP 113/70 mm Hg. T 98.44F, pulse 68/min  Today, I spoke with the couple on the phone. They had questions on expectant management, inpatient versus outpatient management. Patient does not have active vaginal bleeding.  I counseled the couple on the following: -Based on my previous finding and follow-up ultrasound, the amniotic membrane is in the vagina. This is associated with a very high risk of miscarriage or preterm delivery (after 23 weeks). Rupture of membranes can occur. -In the absence of vaginal bleeding or rupture of membranes, outpatient management is reasonable and is not associated with adverse outcomes. Patient is likely to have rupture of membranes at home that can lead to spontaneous miscarriage. -If patient is unsure of termination of pregnancy, continuation of pregnancy is recommended. -I counseled her that termination of pregnancy will be recommended if maternal fever or heavy vaginal bleeding occurs. Sepsis should be avoided. -Patient can check her temperature at home and call her doctor if she has fever. I advised against sexual  intercourse or any tampon insertion into the vagina. The couple will discuss with Dr. Delora Fuel about discharge. Case manager can also be consulted to discuss insurance coverage for hospital stay.  Thank you for consultation. If you have any questions, please contact me at the [facility]. Consultation including face-to-face counseling 15 minutes.

## 2021-02-13 NOTE — Progress Notes (Addendum)
RN to room, pt feeling pressure and "like something in vagina." RN observed what appeared to be bulging bag, at 2nd check with another RN, not seen. pt stable afebrile, resting. OB notified of status, will follow up with patient. Continue to monitor.

## 2021-02-13 NOTE — Progress Notes (Signed)
Spoke with patient and her husband via phone. She and her husband have decided to continue the pregnancy but do not feel comfortable being discharged home. She states that she is able to feel the amniotic fluid bag while voiding. Has been using the bedpan today. She is unable to imagine sitting in a wheelchair or walking to the car due to the pressure. The bag of membranes will occasionally protrude from the vagina with voiding but return within the vagina while in Trendelenburg. I have reviewed the Amnisure positive result with Dr. Donalee Citrin this morning  - could potentially be a false positive due to the amniotic bag sitting within the vagina vs high membrane leak. Amniotic fluid is normal on [place] without further leaking. Given membranes extending to or past vagina, okay to remain in patient overnight. Dr. Landry Mellow to return in AM to review overall plan of care.  Drema Dallas, DO

## 2021-02-13 NOTE — Progress Notes (Addendum)
Antepartum Progress Note  Subjective: Patient feeling okay this morning. She and her husband are struggling with making the decision to terminate the pregnancy. Had [place] last night due to concern for FHR. States verbally from the sonographer that the FHR was 130-150s and fluid level was 2x2cm. States she believes she was told her cervix was dilated to 7cm based on US findings. They would like to speak with Dr. Donalee Citrin this morning. Denies fevers, chills, chest pain, SOB, or bilateral LE edema.  Objective: BP 104/65 (BP Location: Right Arm)   Pulse 65   Temp 98.5 F (36.9 C) (Oral)   Resp 16   Ht 5\' 7"  (1.702 m)   Wt 78 kg   LMP 09/19/2020   SpO2 100%   BMI 26.94 kg/m  Gen: Tearful Cardio:  RRR Lungs:  CTAB, no wheezes/rales/rhonchi Abd:  Soft, gravid, non-tender, no fundal tenderness Ext:  No bilateral calf tenderness, SCDs on and working  A/P: G1P0 at [redacted]w[redacted]d admitted for cervical insufficiency with advanced cervical dilation and subsequent previable PPROM.  - Vital signs reviewed and are normal range - No current signs/symptoms of infection - Still undecided on how to proceed regarding expectant management vs termination. Plans to decide pending [place] read and discussion with Dr. Donalee Citrin (MFM). - Will follow-up after official US read and discussion with Dr. Donalee Citrin.  Drema Dallas, DO

## 2021-02-14 DIAGNOSIS — N883 Incompetence of cervix uteri: Secondary | ICD-10-CM | POA: Diagnosis present

## 2021-02-14 LAB — CULTURE, BETA STREP (GROUP B ONLY)

## 2021-02-14 MED ORDER — BISACODYL 10 MG RE SUPP
10.0000 mg | Freq: Once | RECTAL | Status: AC
  Administered 2021-02-14: 10 mg via RECTAL
  Filled 2021-02-14: qty 1

## 2021-02-14 NOTE — Progress Notes (Signed)
Maria Murray is a 35 y.o. female  G1P0 at 20 weeks and 6 days admitted with cervical insufficiency.  Subjective: pt reports feeling pressure and seeing Membranes at introitus when she goes to the restroom. She is afraid to go home on bedrest. No additional leakage of fluid +FM no vaginal bleeding currently.   Vitals:   02/14/21 0736 02/14/21 1119 02/14/21 1120 02/14/21 1524  BP: 101/60 (!) 98/54  (!) 108/58  Pulse: 69 63  69  Resp: 18 18  18   Temp: 97.9 F (36.6 C) 98.4 F (36.9 C)  97.8 F (36.6 C)  TempSrc: Oral Oral  Oral  SpO2: 97% 98% 98% 99%  Weight:      Height:        General Alert and oriented NAD in trendelenburg postion  Resp NO distress  Abdomen gravid nontender  Ext SCD's in place no evidence of edema.  Speculum and digital exam deferred.  + Amniosure on 02/12/2021  A/P  20 weeks and 6 days with cervical insufficiency advanced cervical dilation with possible PPROM.  - pt desires to continue with pregnancy . She is afraid to go home on bedrest.. will continue bedrest and trendelenberg position inpatient for now.  - monitor daily for signs of chorio. No signs present currently.  - plan steroids for fetal lung maturity at 22 weeks and 6 days.

## 2021-02-15 NOTE — Progress Notes (Signed)
Maria Murray is a 35 y.o. female  G1P0 at 21 weeks and 0 days admitted with cervical insufficiency  Subjective: pt reports more leakage from vaginal overnight. She still feels fetal movement . No contractions no vaginal bleeding. She would like to continue with expectant management at this time.   Vitals:   02/14/21 2334 02/14/21 2335 02/15/21 0432 02/15/21 0824  BP: 101/61  112/65 (!) 103/58  Pulse: 61  69 61  Resp: 18  18 17   Temp: 98.1 F (36.7 C)  98.2 F (36.8 C) 98.1 F (36.7 C)  TempSrc: Oral  Oral Oral  SpO2:  96% 100% 99%  Weight:      Height:         General Alert and oriented NAD in trendelenburg postion Resp NO distress Abdomen gravid nontender Ext SCD's in place no evidence of edema. Speculum and digital exam deferred. + Amniosure on 02/12/2021  A/P 21 weeks and 0 days with cervical insufficiency with advanced cervical dilation and possible PPROM - pt desires expectant management will contine bedrest and trendelenberg position inpatient for now.  - no signs of chorio on exam today  - plan to administer steroids for fetal lung maturity at 22 weeks and 6 days.

## 2021-02-16 LAB — CBC
HCT: 38.3 % (ref 36.0–46.0)
Hemoglobin: 12.8 g/dL (ref 12.0–15.0)
MCH: 29.6 pg (ref 26.0–34.0)
MCHC: 33.4 g/dL (ref 30.0–36.0)
MCV: 88.5 fL (ref 80.0–100.0)
Platelets: 197 10*3/uL (ref 150–400)
RBC: 4.33 MIL/uL (ref 3.87–5.11)
RDW: 12.5 % (ref 11.5–15.5)
WBC: 12 10*3/uL — ABNORMAL HIGH (ref 4.0–10.5)
nRBC: 0 % (ref 0.0–0.2)

## 2021-02-16 LAB — TYPE AND SCREEN
ABO/RH(D): O POS
Antibody Screen: NEGATIVE

## 2021-02-16 NOTE — Progress Notes (Addendum)
Maria Murray is a 35 y.o. female  G1P0 at 21 weeks and 1 days admitted with cervical insufficiency   Subjective: pt reports decreased  leakage from vaginal overnight. She still feels fetal movement . No contractions no vaginal bleeding. She would like to continue with expectant management at this time.    Vitals:   02/15/21 2320 02/16/21 0449 02/16/21 0810 02/16/21 1223  BP:  101/62 97/63 (!) 107/58  Pulse:  74 67 76  Resp:  17 16 16   Temp:  98.1 F (36.7 C) 98 F (36.7 C) 98.2 F (36.8 C)  TempSrc:  Oral Oral Oral  SpO2: 98% 100% 100% 98%  Weight:      Height:          General Alert and oriented NAD in trendelenburg postion Resp NO distress Abdomen gravid nontender Ext SCD's in place no evidence of edema. Speculum and digital exam deferred. + Amniosure on 02/12/2021   A/P 21 weeks and 1 days with cervical insufficiency with advanced cervical dilation and possible PPROM - pt desires expectant management will contine bedrest and trendelenberg position inpatient for now. - no signs of chorio on exam today - plan u/s weekly to check amniotic fluid next to be performed 02/19/2021 - plan to administer steroids for fetal lung maturity at 22 weeks and 6 days.

## 2021-02-17 DIAGNOSIS — Z3A21 21 weeks gestation of pregnancy: Secondary | ICD-10-CM

## 2021-02-17 NOTE — Progress Notes (Signed)
Follow up visit with Maria Murray and Maria Murray. [name] reports she is feeling somewhat better this week. She's gotten into a bit of a routine and is trying to take things day by day. Maria Murray has been staying with her during the day and is a good support. She reports getting sleep and good coping skills. Chaplain asked open ended questions to offer space for processing. The couple shared that they wish they could get in a time machine and go back to 12 weeks for a cerclage or forward so that they know the baby would be safe, but are doing "okay" waiting in the present for now.   Will continue to follow.  Please page as further needs arise.  Donald Prose. Elyn Peers, M.Div. [facility] [address] Chaplain Pager [phone] Office [phone]

## 2021-02-17 NOTE — Progress Notes (Signed)
Maria Murray is a 35 y/o female G1P0 at 21 weeks and 2 days admitted with cervical insufficiency.   Subjective. Pt reports episode of cramping last night that has since resolved. Continued leakage of fluid. Reports 1 small spot of vaginal bleeding. She denies pelvic pain.   Vitals:   02/16/21 2319 02/17/21 0353 02/17/21 0720 02/17/21 1106  BP: (!) 106/59 (!) 106/59 (!) 111/57 (!) 104/53  Pulse: 70 91 82 70  Resp: 18 18 18 18   Temp: 98.2 F (36.8 C) 98 F (36.7 C) 97.7 F (36.5 C) 97.7 F (36.5 C)  TempSrc: Axillary Oral Oral Oral  SpO2: 99% 98% 96% 95%  Weight:      Height:        General Alert and oriented NAD in trendelenburg postion Resp NO distress Abdomen gravid nontender Ext SCD's in place no evidence of edema. Speculum and digital exam deferred. + Amniosure on 02/12/2021   A/P 21 weeks and 2 days with cervical insufficiency with advanced cervical dilation and possible PPROM - pt desires expectant management will contine bedrest and trendelenberg position inpatient for now. - no signs of chorio on exam today - plan u/s weekly to check amniotic fluid next to be performed 02/19/2021 - plan to administer steroids for fetal lung maturity at 22 weeks and 6 days

## 2021-02-17 NOTE — Progress Notes (Addendum)
Initial Nutrition Assessment  DOCUMENTATION CODES:  Not applicable  INTERVENTION:  Regular Diet Pt may order double protein portions and snacks TID if she makes request when ordering meals   Extensive conversation w/ pt and husband about healthy food options, amts of protein/ fluid/ Calcium and Vit D to consume  NUTRITION DIAGNOSIS:   Increased nutrient needs related to  (r/t pregnancy and fetal growth requirements) as evidenced by  (21 weeks IUP).  GOAL:  Patient will meet greater than or equal to 90% of their needs, Weight gain  MONITOR:  Weight trends  REASON FOR ASSESSMENT:  Antenatal, LOS   ASSESSMENT:  21 weeks and 2 days admitted with cervical insufficiency. Weight at 14 weeks, 74.8 kg, BMI 25.8. weight gain of 7 lbs to date   Diet Order:   Diet Order             Diet regular Room service appropriate? Yes; Fluid consistency: Thin  Diet effective now                   EDUCATION NEEDS:   No education needs have been identified at this time  Skin:  Skin Assessment: Reviewed RN Assessment  Height:   Ht Readings from Last 1 Encounters:  02/10/21 5\' 7"  (1.702 m)   Weight:   Wt Readings from Last 1 Encounters:  02/10/21 78 kg   Ideal Body Weight:   135 lbs  BMI:  Body mass index is 26.94 kg/m.  Estimated Nutritional Needs:   Kcal:  2200-2400  Protein:  98 - 108 g  Fluid:  2.5 L

## 2021-02-17 NOTE — Progress Notes (Addendum)
[facility]-up [facility] consultation  Maria Murray, G1, P0 at 21 weeks 2 days gestation was admitted on 02/10/2021 with cervical incompetence.  She was initially admitted for rescue cerclage.  On examination the cervix was 4 to 5 cm dilated and the bag of membranes were present half-way in the vagina.  She was counseled that rescue cerclage cannot be performed because of advanced cervical dilation.  After counseling, currently discussed between themselves and opted to continue the pregnancy with expectant management. Currently, she is in Trendelenburg position and she had ultrasound last week to rule out rupture of membranes.  Limited ultrasound study showed normal amniotic fluid. Patient does not have symptoms of uterine contractions or vaginal bleeding. P/E patient is comfortably lying in bed; not in pain Vital stable  Abdomen: Soft, gravid uterus; no tenderness in any of the quadrants.  I counseled the patient on the following: -I agreed and supported her decision of expectant management. -The risk of miscarriage/preterm delivery is still very high. -In the absence of fever or vaginal bleeding, expectant management cancontinue. -Bedrest and pregnancy increases risk of venous thromboembolism.  I discussed prophylactic  anticoagulation with heparin.  I explained that heparin does not cross the placenta and does not cause adverse fetal outcomes.  Low and may be switched molecular weight heparin may be started now to unfractionated heparin after viability. -Patient is aware that antenatal corticosteroids will be given at 22 weeks and 5 days gestation. -I recommend fetal growth assessment daily for NICU consultation (around 22 weeks and 5 days gestation). -I discussed delivery of a periviable infant (23 weeks) and that cesarean section will be recommended if malpresentation is seen.  -Decision to deliver can be challenging and the couple will benefit from NICU consultation.  Patient has a choice of not to have cesarean delivery if she feels, after NICU consultation, that poor neonatal outcome is expected. -I informed her that she is more likely to have classical cesarean delivery that will impact future pregnancies (mode of delivery).  Recommendations Continue expectant management. -Limited ultrasound study distally to check amniotic fluid. -Fetal growth assessment (detailed ultrasound) around 22 weeks and 5 days gestation before NICU consultation.  -Lovenox 40 mg subcutaneously daily. -Unfractionated heparin 7500 units twice daily after [redacted] weeks gestation.  Thank you for consultation.  If you have any questions or concerns, please call me at the [facility]. Consultation including face-to-face counseling 35 minutes.

## 2021-02-18 ENCOUNTER — Inpatient Hospital Stay (HOSPITAL_COMMUNITY): Payer: Managed Care, Other (non HMO) | Admitting: Anesthesiology

## 2021-02-18 ENCOUNTER — Encounter (HOSPITAL_COMMUNITY): Payer: Self-pay | Admitting: Obstetrics and Gynecology

## 2021-02-18 ENCOUNTER — Inpatient Hospital Stay (HOSPITAL_BASED_OUTPATIENT_CLINIC_OR_DEPARTMENT_OTHER): Payer: Managed Care, Other (non HMO)

## 2021-02-18 DIAGNOSIS — O4692 Antepartum hemorrhage, unspecified, second trimester: Secondary | ICD-10-CM

## 2021-02-18 DIAGNOSIS — O42912 Preterm premature rupture of membranes, unspecified as to length of time between rupture and onset of labor, second trimester: Secondary | ICD-10-CM | POA: Diagnosis not present

## 2021-02-18 DIAGNOSIS — Z3A21 21 weeks gestation of pregnancy: Secondary | ICD-10-CM | POA: Diagnosis not present

## 2021-02-18 LAB — CBC WITH DIFFERENTIAL/PLATELET
Abs Immature Granulocytes: 0.12 10*3/uL — ABNORMAL HIGH (ref 0.00–0.07)
Basophils Absolute: 0 10*3/uL (ref 0.0–0.1)
Basophils Relative: 0 %
Eosinophils Absolute: 0.1 10*3/uL (ref 0.0–0.5)
Eosinophils Relative: 1 %
HCT: 37.3 % (ref 36.0–46.0)
Hemoglobin: 12.8 g/dL (ref 12.0–15.0)
Immature Granulocytes: 1 %
Lymphocytes Relative: 9 %
Lymphs Abs: 1.2 10*3/uL (ref 0.7–4.0)
MCH: 29.5 pg (ref 26.0–34.0)
MCHC: 34.3 g/dL (ref 30.0–36.0)
MCV: 85.9 fL (ref 80.0–100.0)
Monocytes Absolute: 0.9 10*3/uL (ref 0.1–1.0)
Monocytes Relative: 7 %
Neutro Abs: 10.5 10*3/uL — ABNORMAL HIGH (ref 1.7–7.7)
Neutrophils Relative %: 82 %
Platelets: 197 10*3/uL (ref 150–400)
RBC: 4.34 MIL/uL (ref 3.87–5.11)
RDW: 12.4 % (ref 11.5–15.5)
WBC: 12.8 10*3/uL — ABNORMAL HIGH (ref 4.0–10.5)
nRBC: 0 % (ref 0.0–0.2)

## 2021-02-18 MED ORDER — LACTATED RINGERS IV SOLN: INTRAVENOUS | Status: DC

## 2021-02-18 MED ORDER — DIPHENHYDRAMINE HCL 25 MG PO CAPS: 25.0000 mg | ORAL_CAPSULE | Freq: Four times a day (QID) | ORAL | Status: DC | PRN

## 2021-02-18 MED ORDER — DIPHENHYDRAMINE HCL 50 MG/ML IJ SOLN: 12.5000 mg | INTRAMUSCULAR | Status: DC | PRN

## 2021-02-18 MED ORDER — LIDOCAINE-EPINEPHRINE (PF) 2 %-1:200000 IJ SOLN
INTRAMUSCULAR | Status: DC | PRN
  Administered 2021-02-18: 4 mL via EPIDURAL

## 2021-02-18 MED ORDER — OXYTOCIN-SODIUM CHLORIDE 30-0.9 UT/500ML-% IV SOLN: 2.5000 [IU]/h | INTRAVENOUS | Status: DC

## 2021-02-18 MED ORDER — OXYTOCIN BOLUS FROM INFUSION
333.0000 mL | Freq: Once | INTRAVENOUS | Status: AC
  Administered 2021-02-18: 333 mL via INTRAVENOUS

## 2021-02-18 MED ORDER — ACETAMINOPHEN 325 MG PO TABS: 650.0000 mg | ORAL_TABLET | ORAL | Status: DC | PRN

## 2021-02-18 MED ORDER — FENTANYL-BUPIVACAINE-NACL 0.5-0.125-0.9 MG/250ML-% EP SOLN
12.0000 mL/h | EPIDURAL | Status: DC | PRN
  Administered 2021-02-18: 12 mL/h via EPIDURAL
  Filled 2021-02-18: qty 250

## 2021-02-18 MED ORDER — SIMETHICONE 80 MG PO CHEW: 80.0000 mg | CHEWABLE_TABLET | ORAL | Status: DC | PRN

## 2021-02-18 MED ORDER — FENTANYL CITRATE (PF) 100 MCG/2ML IJ SOLN: 100.0000 ug | INTRAMUSCULAR | Status: DC | PRN

## 2021-02-18 MED ORDER — MISOPROSTOL 200 MCG PO TABS
400.0000 ug | ORAL_TABLET | Freq: Once | ORAL | Status: AC
  Administered 2021-02-18: 400 ug via RECTAL
  Filled 2021-02-18: qty 2

## 2021-02-18 MED ORDER — IBUPROFEN 600 MG PO TABS
600.0000 mg | ORAL_TABLET | Freq: Four times a day (QID) | ORAL | Status: DC
  Filled 2021-02-18: qty 1

## 2021-02-18 MED ORDER — ZOLPIDEM TARTRATE 5 MG PO TABS
5.0000 mg | ORAL_TABLET | Freq: Every evening | ORAL | Status: DC | PRN
Start: 2021-02-18 — End: 2021-02-19

## 2021-02-18 MED ORDER — DIBUCAINE (PERIANAL) 1 % EX OINT: 1.0000 "application " | TOPICAL_OINTMENT | CUTANEOUS | Status: DC | PRN

## 2021-02-18 MED ORDER — WITCH HAZEL-GLYCERIN EX PADS: 1.0000 "application " | MEDICATED_PAD | CUTANEOUS | Status: DC | PRN

## 2021-02-18 MED ORDER — PHENYLEPHRINE 40 MCG/ML (10ML) SYRINGE FOR IV PUSH (FOR BLOOD PRESSURE SUPPORT): 80.0000 ug | PREFILLED_SYRINGE | INTRAVENOUS | Status: DC | PRN

## 2021-02-18 MED ORDER — SENNOSIDES-DOCUSATE SODIUM 8.6-50 MG PO TABS: 2.0000 | ORAL_TABLET | Freq: Every day | ORAL | Status: DC

## 2021-02-18 MED ORDER — OXYTOCIN-SODIUM CHLORIDE 30-0.9 UT/500ML-% IV SOLN
INTRAVENOUS | Status: AC
  Administered 2021-02-18: 2.5 [IU]/h via INTRAVENOUS
  Filled 2021-02-18: qty 500

## 2021-02-18 MED ORDER — FENTANYL CITRATE (PF) 100 MCG/2ML IJ SOLN: 50.0000 ug | INTRAMUSCULAR | Status: DC | PRN

## 2021-02-18 MED ORDER — EPHEDRINE 5 MG/ML INJ: 10.0000 mg | INTRAVENOUS | Status: DC | PRN

## 2021-02-18 MED ORDER — METHYLERGONOVINE MALEATE 0.2 MG/ML IJ SOLN: 0.2000 mg | INTRAMUSCULAR | Status: DC | PRN

## 2021-02-18 MED ORDER — FENTANYL CITRATE (PF) 100 MCG/2ML IJ SOLN
INTRAMUSCULAR | Status: AC
  Administered 2021-02-18: 50 ug via INTRAVENOUS
  Filled 2021-02-18: qty 2

## 2021-02-18 MED ORDER — METHYLERGONOVINE MALEATE 0.2 MG PO TABS
0.2000 mg | ORAL_TABLET | Freq: Four times a day (QID) | ORAL | Status: DC
  Administered 2021-02-18: 0.2 mg via ORAL
  Filled 2021-02-18 (×2): qty 1

## 2021-02-18 MED ORDER — METHYLERGONOVINE MALEATE 0.2 MG PO TABS: 0.2000 mg | ORAL_TABLET | Freq: Four times a day (QID) | ORAL | Status: DC

## 2021-02-18 MED ORDER — ONDANSETRON HCL 4 MG PO TABS: 4.0000 mg | ORAL_TABLET | ORAL | Status: DC | PRN

## 2021-02-18 MED ORDER — BENZOCAINE-MENTHOL 20-0.5 % EX AERO: 1.0000 "application " | INHALATION_SPRAY | CUTANEOUS | Status: DC | PRN

## 2021-02-18 MED ORDER — LACTATED RINGERS IV SOLN
500.0000 mL | Freq: Once | INTRAVENOUS | Status: AC
Start: 2021-02-18 — End: 2021-02-18
  Administered 2021-02-18: 500 mL via INTRAVENOUS

## 2021-02-18 MED ORDER — ONDANSETRON HCL 4 MG/2ML IJ SOLN: 4.0000 mg | INTRAMUSCULAR | Status: DC | PRN

## 2021-02-18 MED ORDER — METHYLERGONOVINE MALEATE 0.2 MG PO TABS: 0.2000 mg | ORAL_TABLET | ORAL | Status: DC | PRN

## 2021-02-18 MED ORDER — OXYTOCIN 10 UNIT/ML IJ SOLN
INTRAMUSCULAR | Status: AC
  Filled 2021-02-18: qty 1

## 2021-02-18 NOTE — Progress Notes (Signed)
Follow up visit with Maria Murray and Maria Murray. Maria Murray reports the epidural is helping her manage her pain. Couple had questions about "what comes next with the baby" and chaplain provided education and support about options after birth and choices they may want to make depending on whether their son still has a heart beat or not. The couple was surprised to learn that they would need to choose a funeral home and said they thought they would hand him to the nurse or doctor and that would be their closure. CHaplain offered empathy and support and explained processes for choosing funeral homes and disposition plans at couple's request. Pt requested a cross for the baby, which I provided in addition to a small Bible and a prayer shawl.  Will continue to follow.  Please page as further needs arise.  Donald Prose. Elyn Peers, M.Div. [facility] Chaplain Pager [phone] Office [phone]

## 2021-02-18 NOTE — Progress Notes (Signed)
Bedside U/S:    -vertex presentation    -fetus appears engaged w/ cervix    -amniotic sac appears intact  Burman Foster, MSN, CNM 02/18/2021 4:53 AM

## 2021-02-18 NOTE — Plan of Care (Signed)
  Problem: Clinical Measurements: Goal: Ability to maintain clinical measurements within normal limits will improve Outcome: Progressing   Problem: Activity: Goal: Risk for activity intolerance will decrease Outcome: Progressing   Problem: Elimination: Goal: Will not experience complications related to bowel motility Outcome: Adequate for Discharge Goal: Will not experience complications related to urinary retention Outcome: Adequate for Discharge   Problem: Education: Goal: Knowledge of disease or condition will improve Outcome: Progressing   Problem: Coping: Goal: Ability to identify and utilize available support systems will improve Outcome: Progressing Goal: Will verbalize feelings Outcome: Progressing Goal: Decrease level of anxiety will Outcome: Progressing

## 2021-02-18 NOTE — Progress Notes (Signed)
Subjective:    Called by RN for pt's c/o increased abd pain. CNM to bedside. Pt reports increased cramping in LLQ only since 2230 and pink spotting w/ wiping, denies vaginal pressure and LOF. TOCO is uncomfortable and unable to trace ctx. CNM removed TOCO for pt's comfort. Spouse timing ctx via an app. Multiple questions answered RE: management and pain relief. Pt requesting an ultrasound to determine cervical dilation and if she is laboring, advised against a transvaginal US to avoid ROM, pt agrees to abd [place] to confirm vertex presentation, but informed that this may not diagnose labor or predict birth time.   Objective:    VS: BP 107/61 (BP Location: Right Arm)   Pulse 66   Temp 98.2 F (36.8 C) (Oral)   Resp 18   Ht 5\' 7"  (1.702 m)   Wt 78 kg   LMP 09/19/2020   SpO2 100%   BMI 26.94 kg/m  FHR doppler: 168 Toco: contractions every 4-22 minutes  Membranes: intact   Abd: soft, gravid, no rebound tenderness GU: pink vaginal discharge  Assessment/Plan:   35 y.o. G1P0 [redacted]w[redacted]d Cervical insufficiency Suspect preterm labor Expectant management Offered IV sedation and pt declined Bedside US to confirm presentation  Arrie Eastern MSN, CNM 02/18/2021 12:51 AM

## 2021-02-18 NOTE — Progress Notes (Signed)
Vaginal bleeding increased, pt transferred to L&D via bed.  Continue expectant management.   Burman Foster, MSN, CNM 02/18/2021 5:01 AM

## 2021-02-18 NOTE — Anesthesia Procedure Notes (Signed)
Epidural Patient location during procedure: OB Start time: 02/18/2021 12:00 PM End time: 02/18/2021 12:10 PM  Staffing Anesthesiologist: Freddrick March, MD Performed: anesthesiologist   Preanesthetic Checklist Completed: patient identified, IV checked, risks and benefits discussed, monitors and equipment checked, pre-op evaluation and timeout performed  Epidural Patient position: sitting Prep: DuraPrep and site prepped and draped Patient monitoring: continuous pulse ox, blood pressure, heart rate and cardiac monitor Approach: midline Location: L3-L4 Injection technique: LOR air  Needle:  Needle type: Tuohy  Needle gauge: 17 G Needle length: 9 cm Needle insertion depth: 6 cm Catheter type: closed end flexible Catheter size: 19 Gauge Catheter at skin depth: 11 cm Test dose: negative  Assessment Sensory level: T8 Events: blood not aspirated, injection not painful, no injection resistance, no paresthesia and negative IV test  Additional Notes Patient identified. Risks/Benefits/Options discussed with patient including but not limited to bleeding, infection, nerve damage, paralysis, failed block, incomplete pain control, headache, blood pressure changes, nausea, vomiting, reactions to medication both or allergic, itching and postpartum back pain. Confirmed with bedside nurse the patient's most recent platelet count. Confirmed with patient that they are not currently taking any anticoagulation, have any bleeding history or any family history of bleeding disorders. Patient expressed understanding and wished to proceed. All questions were answered. Sterile technique was used throughout the entire procedure. Please see nursing notes for vital signs. Test dose was given through epidural catheter and negative prior to continuing to dose epidural or start infusion. Warning signs of high block given to the patient including shortness of breath, tingling/numbness in hands, complete motor block,  or any concerning symptoms with instructions to call for help. Patient was given instructions on fall risk and not to get out of bed. All questions and concerns addressed with instructions to call with any issues or inadequate analgesia.  Reason for block:procedure for pain

## 2021-02-18 NOTE — Progress Notes (Signed)
In to assess patient. Over night she reports contractions that were painful 7-8 out of 10. She began having heavier vaginal bleeding. U/s was performed. And membranes seen in the vagina with fetal head in vagina.  Currently she is only having occasional contraction and decreased vaginal bleeding.   Vitals:   02/17/21 2351 02/18/21 0220 02/18/21 0457 02/18/21 0723  BP: 107/61  128/75 115/64  Pulse: 66  85 69  Resp:   17 16  Temp: 98.2 F (36.8 C) 98.2 F (36.8 C) 98.4 F (36.9 C) 98 F (36.7 C)  TempSrc: Oral Oral Oral Oral  SpO2:      Weight:      Height:        General Alert and oriented in trendelenburg position.  Abdomen Gravid nontender  Ext no evidence of DVT Speculum exam.. Membrances seen just inside introitus with fetal head behind membranes.   A/P 21 weeks and 3 days with cervical insufficiency and labor with inevitable delivery.  D/w expectant management vs augmenting delivery with cytotec.  She desires to augment delivery given fetal parts in vagina  Plan epidural placement if pt desires if not fetanyl IV  Once she has decided on pain management plan cytotec 400 mcg per rectum every 4 hours.  Pt is aware that fetus is previable and no intervention will be provided at delivery for the fetus. She voiced understanding

## 2021-02-18 NOTE — Progress Notes (Signed)
Chaplain follow up visit with Maria Murray at pt's bedside on labor and delivery. Chaplain asked open ended questions and facilitated emotional expression and story telling. Maria Murray shared that last night she begin having bleeding and cramping increased and this morning Dr Landry Mellow was able to visualize the baby. The couple reports they feel sadness and fear, but also peace. Chaplain affirmed Maria Murray's self advocacy and advocacy for her baby and the good mothering work she has been doing. Chaplain provided an opportunity to explore fears about delivery and support and education encouraging pt to consider her hopes for her delivery and her time with her son. The couple think they would like to listen to music during labor and will continue to consider what their needs might be. Chaplain shared family's wishes with pt's RN Ubaldo Glassing. Chaplain also provided FOB with coffee and requested unit secretary order a comfort cart for pt room. Will continue to follow.  Please page as further needs arise.  Donald Prose. Elyn Peers, M.Div. [facility] Chaplain Pager [phone] Office [phone]

## 2021-02-18 NOTE — Anesthesia Preprocedure Evaluation (Signed)
Anesthesia Evaluation  Patient identified by MRN, date of birth, ID band Patient awake    Reviewed: Allergy & Precautions, NPO status , Patient's Chart, lab work & pertinent test results  Airway Mallampati: II  TM Distance: >3 FB Neck ROM: Full    Dental no notable dental hx.    Pulmonary neg pulmonary ROS,    Pulmonary exam normal breath sounds clear to auscultation       Cardiovascular negative cardio ROS Normal cardiovascular exam Rhythm:Regular Rate:Normal     Neuro/Psych negative neurological ROS  negative psych ROS   GI/Hepatic negative GI ROS, Neg liver ROS,   Endo/Other  negative endocrine ROS  Renal/GU negative Renal ROS  negative genitourinary   Musculoskeletal negative musculoskeletal ROS (+)   Abdominal   Peds  Hematology negative hematology ROS (+)   Anesthesia Other Findings Presents at 21 weeks and 3 days with cervical insufficiency and labor with inevitable delivery  Reproductive/Obstetrics (+) Pregnancy                             Anesthesia Physical Anesthesia Plan  ASA: 2  Anesthesia Plan: Epidural   Post-op Pain Management:    Induction:   PONV Risk Score and Plan: Treatment may vary due to age or medical condition  Airway Management Planned: Natural Airway  Additional Equipment:   Intra-op Plan:   Post-operative Plan:   Informed Consent: I have reviewed the patients History and Physical, chart, labs and discussed the procedure including the risks, benefits and alternatives for the proposed anesthesia with the patient or authorized representative who has indicated his/her understanding and acceptance.       Plan Discussed with: Anesthesiologist  Anesthesia Plan Comments: (Patient identified. Risks, benefits, options discussed with patient including but not limited to bleeding, infection, nerve damage, paralysis, failed block, incomplete pain control,  headache, blood pressure changes, nausea, vomiting, reactions to medication, itching, and post partum back pain. Confirmed with bedside nurse the patient's most recent platelet count. Confirmed with the patient that they are not taking any anticoagulation, have any bleeding history or any family history of bleeding disorders. Patient expressed understanding and wishes to proceed. All questions were answered. )        Anesthesia Quick Evaluation

## 2021-02-19 LAB — CBC
HCT: 35 % — ABNORMAL LOW (ref 36.0–46.0)
Hemoglobin: 11.7 g/dL — ABNORMAL LOW (ref 12.0–15.0)
MCH: 29.3 pg (ref 26.0–34.0)
MCHC: 33.4 g/dL (ref 30.0–36.0)
MCV: 87.5 fL (ref 80.0–100.0)
Platelets: 170 10*3/uL (ref 150–400)
RBC: 4 MIL/uL (ref 3.87–5.11)
RDW: 12.5 % (ref 11.5–15.5)
WBC: 9 10*3/uL (ref 4.0–10.5)
nRBC: 0 % (ref 0.0–0.2)

## 2021-02-19 MED ORDER — IBUPROFEN 600 MG PO TABS: 600.0000 mg | ORAL_TABLET | Freq: Four times a day (QID) | ORAL | 0 refills | Status: DC | PRN

## 2021-02-19 MED ORDER — METHYLERGONOVINE MALEATE 0.2 MG PO TABS
0.2000 mg | ORAL_TABLET | Freq: Four times a day (QID) | ORAL | Status: DC
  Administered 2021-02-19: 0.2 mg via ORAL

## 2021-02-19 NOTE — Anesthesia Postprocedure Evaluation (Signed)
Anesthesia Post Note  Patient: Maria Murray  Procedure(s) Performed: AN AD [place]     Patient location during evaluation: Mother Baby Anesthesia Type: Epidural Level of consciousness: awake and alert Pain management: pain level controlled Vital Signs Assessment: post-procedure vital signs reviewed and stable Respiratory status: spontaneous breathing, nonlabored ventilation and respiratory function stable Cardiovascular status: stable Postop Assessment: no headache, no backache, epidural receding, adequate PO intake, patient able to bend at knees, able to ambulate and no apparent nausea or vomiting Anesthetic complications: no   No notable events documented.  Last Vitals:  Vitals:   02/19/21 0519 02/19/21 0738  BP: (!) 99/56 106/68  Pulse: (!) 58 78  Resp: 16 18  Temp: 36.9 C 36.5 C  SpO2: 100% 99%    Last Pain:  Vitals:   02/19/21 0738  TempSrc: Oral  PainSc:    Pain Goal: Patients Stated Pain Goal: 0 (02/18/21 1149)                 Brianni Manthe Hristova

## 2021-02-19 NOTE — Discharge Summary (Signed)
Postpartum Discharge Summary  Date of Service updated 02/19/2021     Patient Name: Maria Murray DOB: September 06, 1985 MRN: [id number]  Date of admission: 02/10/2021 Delivery date:02/18/2021  Delivering provider: Christophe Louis  Date of discharge: 02/19/2021  Admitting diagnosis: Cervical insufficiency during pregnancy in third trimester, antepartum [O34.33] Cervical incompetence [N88.3] Intrauterine pregnancy: [redacted]w[redacted]d    Secondary diagnosis:  Principal Problem:   Cervical insufficiency during pregnancy in third trimester, antepartum Active Problems:   Cervical incompetence Preterm Labor  Additional problems: None    Discharge diagnosis: Preterm Pregnancy Delivered                                              Post partum procedures: None Augmentation: Cytotec [facility]  Pt was admitted at 20 weeks 1 day with cervical insufficiency. Initially a rescue cerclage was planned. However she began to dilate her cervix and became to dilated for cerclage placement. She was placed on bedrest and monitored for signs of chorio daily. She was offered expectant management vs termination due to early gestation and maternal risk of chorioamnionitis. She opted for expectant management. On 02/17/2021 she began having heavier bleeding and descent of the fetus into the vagina. She was officially in labor . Labor was augmented with cytotec 400 mcg per rectum. She delivered 02/18/2021 a stillborn 268 weekand 3 day female fetus. She is discharged on PPD #!  Magnesium Sulfate received: No BMZ received: No Rhophylac:N/A MMR:N/A T-DaP: NA Flu: N/A Transfusion:No  Physical exam  Vitals:   02/18/21 2005 02/18/21 2108 02/19/21 0058 02/19/21 0519  BP:  115/65 102/62 (!) 99/56  Pulse:  72 70 (!) 58  Resp:  _0 Temp:  98.6 F (37 C) 98.3 F (36.8 C) 98.5 F (36.9 C)  TempSrc:  Oral Oral Oral  SpO2: 99% 100% 100% 100%  Weight:      Height:       General: alert,  cooperative, and no distress Lochia: appropriate Uterine Fundus: firm Incision: N/A DVT Evaluation: No evidence of DVT seen on physical exam. Labs: Lab Results  Component Value Date   WBC 9.0 02/19/2021   HGB 11.7 (L) 02/19/2021   HCT 35.0 (L) 02/19/2021   MCV 87.5 02/19/2021   PLT 170 02/19/2021   No flowsheet data found. Edinburgh Score: No flowsheet data found.    After visit meds:  Allergies as of 02/19/2021   No Known Allergies      Medication List     TAKE these medications    cholecalciferol 25 MCG (1000 UNIT) tablet Commonly known as: VITAMIN D3 Take 1,000 Units by mouth daily.   ferrous sulfate 325 (65 FE) MG tablet Take 325 mg by mouth 2 (two) times a week.   ibuprofen 600 MG tablet Commonly known as: ADVIL Take 1 tablet (600 mg total) by mouth every 6 (six) hours as needed.   prenatal multivitamin Tabs tablet Take 1 tablet by mouth daily at 12 noon.   Probiotic Daily Caps Take 1 capsule by mouth daily.         Discharge home in stable condition Infant Feeding:  stillborn Infant DVanderburghDischarge instruction: per After Visit Summary and Postpartum booklet. Activity: Advance as tolerated. Pelvic rest for 6 weeks.  Diet: routine diet Anticipated Birth Control: Unsure Postpartum Appointment:2 weeks Additional Postpartum F/U:  None  Future Appointments:No future appointments. Follow up Visit:  Follow-up Information     Cole, Tara, MD. Schedule an appointment as soon as possible for a visit in 2 week(s).   Specialty: Obstetrics and Gynecology Why: please make an appt for a postpartum visit with Dr. Cole in 2 weeks Contact information: [address] [place] [address] [phone]                     02/19/2021 Tara Cole, MD   

## 2021-02-20 LAB — SURGICAL PATHOLOGY

## 2021-02-28 ENCOUNTER — Telehealth (HOSPITAL_COMMUNITY): Payer: Self-pay | Admitting: *Deleted

## 2021-02-28 NOTE — Telephone Encounter (Signed)
Left message for patient to return nurse call. Odis Hollingshead, RN 02/28/2021 at 10:32am.

## 2021-07-25 ENCOUNTER — Ambulatory Visit: Payer: Managed Care, Other (non HMO)

## 2021-07-25 ENCOUNTER — Telehealth: Payer: Self-pay

## 2021-07-25 NOTE — Telephone Encounter (Signed)
Need to move MD Consult time to 2 or 3pm on Friday 12/16

## 2021-08-08 ENCOUNTER — Ambulatory Visit: Payer: Managed Care, Other (non HMO)

## 2021-08-13 ENCOUNTER — Ambulatory Visit: Payer: Managed Care, Other (non HMO) | Attending: Obstetrics & Gynecology | Admitting: Obstetrics and Gynecology

## 2021-08-13 ENCOUNTER — Other Ambulatory Visit: Payer: Self-pay

## 2021-08-13 ENCOUNTER — Ambulatory Visit: Payer: Managed Care, Other (non HMO) | Admitting: *Deleted

## 2021-08-13 DIAGNOSIS — Z3169 Encounter for other general counseling and advice on procreation: Secondary | ICD-10-CM | POA: Diagnosis not present

## 2021-08-13 DIAGNOSIS — Z8759 Personal history of other complications of pregnancy, childbirth and the puerperium: Secondary | ICD-10-CM | POA: Insufficient documentation

## 2021-08-13 DIAGNOSIS — Z3161 Procreative counseling and advice using natural family planning: Secondary | ICD-10-CM | POA: Diagnosis present

## 2021-08-13 NOTE — Progress Notes (Signed)
Maternal-Fetal Medicine   Name: Maria Murray DOB: 09/11/1995 MRN: [id number] Referring Provider: Dr. Azucena Fallen, MD  I had the pleasure of seeing Maria Murray today at the [facility]. She is G1 P0101 and is here for preconception consultation. She was accompanied by Maria Murray. Obstetric history is significant for a pregnancy loss at 21w 3d gestation. At 14 to 15-weeks' gestation, the patient had vaginal bleeding that resolved. Prenatal screening showed no increased risk for fetal aneuploidies. At 20w 2d gestation at Maria prenatal visit, the external os was open and the membranes were visible. Two weeks before the event, the cervix at office ultrasound was 4 cm long. I was consulted for rescue cerclage.  On vaginal examination after admission, the cervix was 4 to 5 cm dilated with bulging bag of membranes more than halfway into the vagina. I advised against rescue cerclage. Admission WBC was 12.9 but the patient did not have fever or vaginal bleeding. No history of urinary tract infection before admission. Patient opted for expectant management. A week later, on 02/18/21, she had strong uterine contractions and delivered a nonviable female fetus. Placenta weighed 173 grams with evidence of acute chorioamnionitis. Patient had good post-delivery recovery and was discharged in stable condition.  GYN history: No history of abnormal Pap smears or cervical surgeries.  No history of breast disease.  Maria menstrual cycles are regular (4-12/1928 days) and Maria LMP was 08/11/2021.  She is not using any birth control measures. Past medical history: No history of diabetes or hypertension or any chronic medical conditions.  On recent blood work, LDL was increased but the patient is not taking statins. Past surgical history: Nil of note. Medications: Prenatal vitamins, DHA, iron as needed, probiotics. Allergies: No known drug allergies. Social history: Denies tobacco or drug or alcohol use.   She has been married 2 years and Maria Murray is in good health.  Our concerns include: History of mid-trimester pregnancy loss Based on Maria history and the events surrounding the loss, cervical insufficiency is the most-likely cause of miscarriage. We discussed previous admission and delivery. Patient did not have fever or intermittent vaginal bleeding that could account for Maria loss. Recent transvaginal ultrasound performed at your office apparently did not show any uterine malformation or intracavitary lesions. Placental histology showed evidence of acute chorioamnionitis, which could be from prolonged exposure of amniotic membranes leading to intraamniotic infection.  Cervical insufficiency has a high recurrence rate and I recommend prophylactic cerclage that should be performed between 12- and 14-weeks' gestation. Alternatively, weekly transvaginal ultrasound may be performed to measure cervical length and perform rescue cerclage as needed. This is an inferior option considering the strong likelihood of cervical insufficiency based on Maria history. I do not recommend abdominal cerclage since she does not have a history of failed cerclage. I explained cerclage procedure with diagrams and possible complications including bleeding, infection, miscarriage, and injuries to bladder or bowel (rare). Cerclage, however, does not guarantee carrying pregnancy to term.  Patient had questions about vaginal progesterone after cerclage. No studies are available showing the benefit of vaginal progesterone as an adjunct treatment. However, progesterone treatment is not associated with any adverse fetal or neonatal outcomes. Local reactions including itching or burning sensation has been reported by some patients. Since the diagnosis is more-likely consistent with cervical insufficiency, prophylactic progesterone injections (17-OH progesterone) are not recommended.  I recommend cell-free fetal DNA screening and  first-trimester anatomy ultrasound to rule out major anomalies before cerclage. Although transvaginal ultrasound showed  normal uterus and adnexa, I recommend saline-infusion sonography to rule out intracavitary lesions.  Advanced maternal age Patient will be 17 years or older at next delivery.  I counseled that advanced maternal age is associated with increased risk of fetal aneuploidies.  I discussed cell free fetal DNA screening that has a greater detection rate for Down syndrome.  I informed Maria that only invasive testing including chorionic villous sampling or amniocentesis will give a definitive result on the fetal karyotype.  I counseled the patient on the importance of preconception folic acid to reduce the likelihood of open neural tube defects.  It should be taken at least 1 month before planned conception.  Patient takes prenatal vitamins that has 1 mg of folic acid, which is sufficient.  Recommendations -Saline infusion sonography to rule out intracavitary lesions. -Cell free fetal DNA screening after [redacted] weeks gestation. -First-trimester anatomy to rule out major anomalies. -Prophylactic cerclage between 23- and 14-weeks gestation.  Thank you for consultation.  If you have any questions or concerns, please contact me the [facility].  Consultation including face-to-face (more than 50%) counseling 45 minutes.

## 2021-08-13 NOTE — Progress Notes (Signed)
Here for pre conception appointment. BP: 119/67, pulse: 65

## 2022-02-13 LAB — OB RESULTS CONSOLE RUBELLA ANTIBODY, IGM: Rubella: IMMUNE

## 2022-02-13 LAB — OB RESULTS CONSOLE HEPATITIS B SURFACE ANTIGEN: Hepatitis B Surface Ag: NEGATIVE

## 2022-02-13 LAB — OB RESULTS CONSOLE GC/CHLAMYDIA
Chlamydia: NEGATIVE
Neisseria Gonorrhea: NEGATIVE

## 2022-02-13 LAB — OB RESULTS CONSOLE HIV ANTIBODY (ROUTINE TESTING): HIV: NONREACTIVE

## 2022-02-18 ENCOUNTER — Encounter (HOSPITAL_COMMUNITY): Payer: Self-pay | Admitting: *Deleted

## 2022-02-18 ENCOUNTER — Telehealth (HOSPITAL_COMMUNITY): Payer: Self-pay | Admitting: *Deleted

## 2022-02-18 ENCOUNTER — Other Ambulatory Visit: Payer: Self-pay | Admitting: Obstetrics & Gynecology

## 2022-02-18 ENCOUNTER — Encounter (HOSPITAL_COMMUNITY): Payer: Self-pay | Admitting: Obstetrics & Gynecology

## 2022-02-18 NOTE — Telephone Encounter (Signed)
Preadmission screen  

## 2022-02-25 ENCOUNTER — Inpatient Hospital Stay (HOSPITAL_COMMUNITY): Payer: Managed Care, Other (non HMO)

## 2022-02-25 ENCOUNTER — Inpatient Hospital Stay (HOSPITAL_COMMUNITY)
Admission: AD | Admit: 2022-02-25 | Discharge: 2022-02-26 | Disposition: A | Discharge status: Home / Self Care | Payer: Managed Care, Other (non HMO) | Attending: Obstetrics and Gynecology | Admitting: Obstetrics and Gynecology

## 2022-02-25 ENCOUNTER — Encounter (HOSPITAL_COMMUNITY): Payer: Self-pay | Admitting: Obstetrics and Gynecology

## 2022-02-25 DIAGNOSIS — O3432 Maternal care for cervical incompetence, second trimester: Secondary | ICD-10-CM | POA: Diagnosis not present

## 2022-02-25 DIAGNOSIS — O09521 Supervision of elderly multigravida, first trimester: Secondary | ICD-10-CM | POA: Insufficient documentation

## 2022-02-25 DIAGNOSIS — O3433 Maternal care for cervical incompetence, third trimester: Secondary | ICD-10-CM | POA: Diagnosis not present

## 2022-02-25 DIAGNOSIS — Z3A11 11 weeks gestation of pregnancy: Secondary | ICD-10-CM | POA: Diagnosis not present

## 2022-02-25 DIAGNOSIS — N883 Incompetence of cervix uteri: Secondary | ICD-10-CM | POA: Diagnosis not present

## 2022-02-25 DIAGNOSIS — O209 Hemorrhage in early pregnancy, unspecified: Secondary | ICD-10-CM | POA: Insufficient documentation

## 2022-02-25 DIAGNOSIS — O26891 Other specified pregnancy related conditions, first trimester: Secondary | ICD-10-CM | POA: Diagnosis not present

## 2022-02-25 LAB — URINALYSIS, ROUTINE W REFLEX MICROSCOPIC
Bacteria, UA: NONE SEEN
Bilirubin Urine: NEGATIVE
Glucose, UA: NEGATIVE mg/dL
Ketones, ur: NEGATIVE mg/dL
Leukocytes,Ua: NEGATIVE
Nitrite: NEGATIVE
Protein, ur: NEGATIVE mg/dL
Specific Gravity, Urine: 1.001 — ABNORMAL LOW (ref 1.005–1.030)
pH: 6 (ref 5.0–8.0)

## 2022-02-25 NOTE — MAU Note (Signed)
.  Maria Murray is a 36 y.o. at 66w5dhere in MAU reporting: pink VB spotting mixed with normal vaginal discharge with wiping around 1830 today that has now stopped. Pt wearing a pad. Pt reports having lower right sided ABD "twitching" pain with vaginal cramping that is intermittent. Pt denies LOF, recent intercourse and abnormal discharge.  Hx of incompetent cervix with IUFD Onset of complaint: 1830 Pain score: 2/10 Vitals:   02/25/22 2202  BP: 109/65  Pulse: 76  Resp: 18  Temp: 97.8 F (36.6 C)  SpO2: 100%     FHT:159 Lab orders placed from triage:  UA

## 2022-02-26 DIAGNOSIS — O3433 Maternal care for cervical incompetence, third trimester: Secondary | ICD-10-CM | POA: Diagnosis not present

## 2022-02-26 DIAGNOSIS — O209 Hemorrhage in early pregnancy, unspecified: Secondary | ICD-10-CM

## 2022-02-26 DIAGNOSIS — N883 Incompetence of cervix uteri: Secondary | ICD-10-CM

## 2022-02-26 DIAGNOSIS — Z3A12 12 weeks gestation of pregnancy: Secondary | ICD-10-CM | POA: Diagnosis not present

## 2022-02-26 DIAGNOSIS — Z3A11 11 weeks gestation of pregnancy: Secondary | ICD-10-CM

## 2022-02-26 NOTE — MAU Provider Note (Signed)
Chief Complaint: Vaginal Bleeding   Event Date/Time   First Provider Initiated Contact with Patient 02/25/22 2306        SUBJECTIVE HPI: Maria Murray is a 36 y.o. G2P0100 at 56w6dby LMP who presents to maternity admissions reporting pink spotting this evening.  Has occasional right lower abdominal pain. . She denies vaginal itching/burning, urinary symptoms, h/a, dizziness, n/v, or fever/chills.    Pregnancy history is remarkable for a 21 week loss deemed to result from incompetent cervix. She is scheduled for Cerclage on 03/10/22  Vaginal Bleeding The patient's primary symptoms include pelvic pain (intermittent, mild) and vaginal bleeding. The patient's pertinent negatives include no genital itching, genital lesions or genital odor. This is a new problem. The current episode started today. The pain is mild. She is pregnant. Pertinent negatives include no chills, constipation, diarrhea, dysuria, fever or frequency. The vaginal discharge was bloody. The vaginal bleeding is spotting. She has not been passing clots. She has not been passing tissue. Nothing aggravates the symptoms. She has tried nothing for the symptoms.   RN Note: .Maria Egneris a 36y.o. at 156w5dere in MAU reporting: pink VB spotting mixed with normal vaginal discharge with wiping around 1830 today that has now stopped. Pt wearing a pad. Pt reports having lower right sided ABD "twitching" pain with vaginal cramping that is intermittent. Pt denies LOF, recent intercourse and abnormal discharge.  Hx of incompetent cervix with IUFD Onset of complaint: 1830 Pain score: 2/10  Past Medical History:  Diagnosis Date   Anemia    Past Surgical History:  Procedure Laterality Date   NO PAST SURGERIES     Social History   Socioeconomic History   Marital status: Married    Spouse name: Not on file   Number of children: Not on file   Years of education: Not on file   Highest education level: Not on file  Occupational  History   Not on file  Tobacco Use   Smoking status: Never   Smokeless tobacco: Never  Vaping Use   Vaping Use: Never used  Substance and Sexual Activity   Alcohol use: Not Currently   Drug use: Never   Sexual activity: Not Currently  Other Topics Concern   Not on file  Social History Narrative   Not on file   Social Determinants of Health   Financial Resource Strain: Not on file  Food Insecurity: Not on file  Transportation Needs: Not on file  Physical Activity: Not on file  Stress: Not on file  Social Connections: Not on file  Intimate Partner Violence: Not on file   No current facility-administered medications on file prior to encounter.   Current Outpatient Medications on File Prior to Encounter  Medication Sig Dispense Refill   cholecalciferol (VITAMIN D3) 25 MCG (1000 UNIT) tablet Take 1,000 Units by mouth daily.     ferrous sulfate 325 (65 FE) MG tablet Take 325 mg by mouth 2 (two) times a week.     Prenatal Vit-Fe Fumarate-FA (PRENATAL MULTIVITAMIN) TABS tablet Take 1 tablet by mouth daily at 12 noon.     Probiotic Product (PROBIOTIC DAILY) CAPS Take 1 capsule by mouth daily.     No Known Allergies  I have reviewed patient's Past Medical Hx, Surgical Hx, Family Hx, Social Hx, medications and allergies.   ROS:  Review of Systems  Constitutional:  Negative for chills and fever.  Gastrointestinal:  Negative for constipation and diarrhea.  Genitourinary:  Positive for pelvic pain (  intermittent, mild) and vaginal bleeding. Negative for dysuria and frequency.   Review of Systems  Other systems negative   Physical Exam  Physical Exam Patient Vitals for the past 24 hrs:  BP Temp Temp src Pulse Resp SpO2 Height Weight  02/25/22 2228 124/60 -- -- 68 -- -- -- --  02/25/22 2202 109/65 97.8 F (36.6 C) Oral 76 18 100 % 5' 9.69" (1.77 m) 82.4 kg   Constitutional: Well-developed, well-nourished female in no acute distress.  Cardiovascular: normal rate Respiratory:  normal effort GI: Abd soft, non-tender. Pos BS x 4 MS: Extremities nontender, no edema, normal ROM Neurologic: Alert and oriented x 4.  GU: Neg CVAT.  PELVIC EXAM: Cervix pink, visually closed, without lesion, vaginal walls and external genitalia normal        There is small to moderate dark red/brown discharge in vault. Bimanual exam: Cervix 0/long/high, soft, neg CMT, uterus nontender, adnexa without tenderness, enlargement, or mass  FHT 159 by doppler  LAB RESULTS Results for orders placed or performed during the hospital encounter of 02/25/22 (from the past 24 hour(s))  Urinalysis, Routine w reflex microscopic     Status: Abnormal   Collection Time: 02/25/22 10:01 PM  Result Value Ref Range   Color, Urine STRAW (A) YELLOW   APPearance CLEAR CLEAR   Specific Gravity, Urine 1.001 (L) 1.005 - 1.030   pH 6.0 5.0 - 8.0   Glucose, UA NEGATIVE NEGATIVE mg/dL   Hgb urine dipstick SMALL (A) NEGATIVE   Bilirubin Urine NEGATIVE NEGATIVE   Ketones, ur NEGATIVE NEGATIVE mg/dL   Protein, ur NEGATIVE NEGATIVE mg/dL   Nitrite NEGATIVE NEGATIVE   Leukocytes,Ua NEGATIVE NEGATIVE   RBC / HPF 0-5 0 - 5 RBC/hpf   Bacteria, UA NONE SEEN NONE SEEN   Blood Type O+    IMAGING Sonographer states she cannot measure cervical length at this gestational age [place] 46 Comp Less 20 Wks  Result Date: 02/26/2022 CLINICAL DATA:  Vaginal spotting, positive pregnancy Murray EXAM: OBSTETRIC <14 WK ULTRASOUND TECHNIQUE: Transabdominal ultrasound was performed for evaluation of the gestation as well as the maternal uterus and adnexal regions. COMPARISON:  None Available. FINDINGS: Intrauterine gestational sac: Present Yolk sac:  Absent Embryo:  Present Cardiac Activity: Present Heart Rate: 158 bpm CRL:   52.5 mm   11 w 6 d                  [place] EDC: 09/10/2022 Subchorionic hemorrhage:  None visualized. Maternal uterus/adnexae: Ovaries are well visualized. Corpus luteum cyst is noted on the left. IMPRESSION: Single live  intrauterine gestation at 11 weeks 6 days. Electronically Signed   By: Inez Catalina M.D.   On: 02/26/2022 00:00     MAU Management/MDM: I have reviewed the triage vital signs and the nursing notes.   Will check Ultrasound to rule out subchorionic hemorrhage.  Unable to measue CL on [place] at this gestational age. Cervix closed and long by my exam.  Consult Dr Murrell Redden with presentation, exam findings, and results.  She will notify Dr Benjie Karvonen to inform her of patient presentation and determine plan of care   ASSESSMENT 1. Vaginal bleeding in pregnancy, first trimester   2.     Single IUP at 13w6d3.      History of incompetent Cervix  PLAN Discharge home Pelvic rest Bleeding and pain precautions Followup in office with Dr MBenjie Karvonen Pt stable at time of discharge. Encouraged to return here if she develops worsening of symptoms,  increase in pain, fever, or other concerning symptoms.    Hansel Feinstein CNM, MSN Certified Nurse-Midwife 02/26/2022  12:01 AM

## 2022-02-27 ENCOUNTER — Encounter (HOSPITAL_COMMUNITY): Payer: Self-pay | Admitting: Obstetrics and Gynecology

## 2022-02-27 ENCOUNTER — Inpatient Hospital Stay (HOSPITAL_COMMUNITY)
Admission: AD | Admit: 2022-02-27 | Discharge: 2022-02-27 | Disposition: A | Discharge status: Home / Self Care | Payer: Managed Care, Other (non HMO) | Attending: Obstetrics and Gynecology | Admitting: Obstetrics and Gynecology

## 2022-02-27 ENCOUNTER — Other Ambulatory Visit: Payer: Self-pay

## 2022-02-27 DIAGNOSIS — N883 Incompetence of cervix uteri: Secondary | ICD-10-CM

## 2022-02-27 DIAGNOSIS — O3432 Maternal care for cervical incompetence, second trimester: Secondary | ICD-10-CM | POA: Insufficient documentation

## 2022-02-27 DIAGNOSIS — Z3A12 12 weeks gestation of pregnancy: Secondary | ICD-10-CM

## 2022-02-27 DIAGNOSIS — O26851 Spotting complicating pregnancy, first trimester: Secondary | ICD-10-CM | POA: Insufficient documentation

## 2022-02-27 DIAGNOSIS — N8312 Corpus luteum cyst of left ovary: Secondary | ICD-10-CM | POA: Diagnosis not present

## 2022-02-27 DIAGNOSIS — O3481 Maternal care for other abnormalities of pelvic organs, first trimester: Secondary | ICD-10-CM | POA: Diagnosis not present

## 2022-02-27 DIAGNOSIS — O209 Hemorrhage in early pregnancy, unspecified: Secondary | ICD-10-CM

## 2022-02-27 DIAGNOSIS — O3433 Maternal care for cervical incompetence, third trimester: Secondary | ICD-10-CM | POA: Diagnosis not present

## 2022-02-27 NOTE — MAU Note (Addendum)
.  Maria Murray is a 36 y.o. at 48w0dhere in MAU reporting was seen here yesterday with spotting. Continues to have spotting that has changed from brown to red. No pain. Hx 21wk loss.   Onset of complaint: 2 days Pain score: 0 Vitals:   02/27/22 0422 02/27/22 0423  BP:  105/67  Pulse: 66   Resp: 17   Temp: 98.3 F (36.8 C)   SpO2: 100%      FHT:161 Lab orders placed from triage:  none

## 2022-02-27 NOTE — MAU Provider Note (Signed)
Chief Complaint: Vaginal Bleeding   Event Date/Time   First Provider Initiated Contact with Patient 02/27/22 0451        SUBJECTIVE HPI: Maria Murray is a 36 y.o. G2P0100 at 61w0dby LMP who presents to maternity admissions reporting vaginal bleeding  She was seen for the same last night   She has a history of incompetent cervix and is scheduled for a cerclage in a few weeks.  Exam last night showed dark red blood with no fresh bleeding.  Cervix was closed and long. Her next appt is scheduled for Mon. She denies urinary symptoms, h/a, dizziness, n/v, or fever/chills.    Vaginal Bleeding The patient's primary symptoms include vaginal bleeding. The patient's pertinent negatives include no genital itching, genital lesions, genital odor or pelvic pain. This is a recurrent problem. She is pregnant. Pertinent negatives include no abdominal pain, back pain, chills, dysuria, flank pain, nausea or vomiting. The vaginal discharge was bloody (bleeding had been dark but tonight it was more bright red). The vaginal bleeding is lighter than menses. She has not been passing clots. She has not been passing tissue. Nothing aggravates the symptoms. She has tried nothing for the symptoms. She is not sexually active.   RN Note: Maria Murray a 36y.o. at 160w0dere in MAU reporting was seen here yesterday with spotting. Continues to have spotting that has changed from brown to red. No pain. Hx 21wk loss.   Onset of complaint: 2 days Pain score: 0  Past Medical History:  Diagnosis Date   Anemia    Past Surgical History:  Procedure Laterality Date   NO PAST SURGERIES     Social History   Socioeconomic History   Marital status: Married    Spouse name: Not on file   Number of children: Not on file   Years of education: Not on file   Highest education level: Not on file  Occupational History   Not on file  Tobacco Use   Smoking status: Never   Smokeless tobacco: Never  Vaping Use   Vaping Use:  Never used  Substance and Sexual Activity   Alcohol use: Not Currently   Drug use: Never   Sexual activity: Not Currently  Other Topics Concern   Not on file  Social History Narrative   Not on file   Social Determinants of Health   Financial Resource Strain: Not on file  Food Insecurity: Not on file  Transportation Needs: Not on file  Physical Activity: Not on file  Stress: Not on file  Social Connections: Not on file  Intimate Partner Violence: Not on file   No current facility-administered medications on file prior to encounter.   Current Outpatient Medications on File Prior to Encounter  Medication Sig Dispense Refill   cholecalciferol (VITAMIN D3) 25 MCG (1000 UNIT) tablet Take 1,000 Units by mouth daily.     ferrous sulfate 325 (65 FE) MG tablet Take 325 mg by mouth 2 (two) times a week.     Prenatal Vit-Fe Fumarate-FA (PRENATAL MULTIVITAMIN) TABS tablet Take 1 tablet by mouth daily at 12 noon.     Probiotic Product (PROBIOTIC DAILY) CAPS Take 1 capsule by mouth daily.     No Known Allergies  I have reviewed patient's Past Medical Hx, Surgical Hx, Family Hx, Social Hx, medications and allergies.   ROS:  Review of Systems  Constitutional:  Negative for chills.  Gastrointestinal:  Negative for abdominal pain, nausea and vomiting.  Genitourinary:  Positive for  vaginal bleeding. Negative for dysuria, flank pain and pelvic pain.  Musculoskeletal:  Negative for back pain.   Review of Systems  Other systems negative   Physical Exam  Physical Exam Patient Vitals for the past 24 hrs:  BP Temp Pulse Resp SpO2 Height Weight  02/27/22 0442 111/66 -- 67 -- -- -- --  02/27/22 0423 105/67 -- -- -- -- -- --  02/27/22 0422 -- 98.3 F (36.8 C) 66 17 100 % '5\' 9"'$  (1.753 m) 81.6 kg   Constitutional: Well-developed, well-nourished female in no acute distress.  Cardiovascular: normal rate Respiratory: normal effort GI: Abd soft, non-tender. Pos BS x 4 MS: Extremities nontender,  no edema, normal ROM Neurologic: Alert and oriented x 4.  GU: Neg CVAT.  PELVIC EXAM: Cervix pink, visually closed, without lesion, vaginal walls and external genitalia normal    There is small to moderate brown mucous in vault  Cleaned with Fox swab, no lesions on cervix. No evidence of polyp.   No red blood.   Digital exam confirms long and closed cervix  FHT 161 by doppler  LAB RESULTS No results found for this or any previous visit (from the past 24 hour(s)). O+ blood type    IMAGING Yesterday US OB Comp Less 14 Wks  Result Date: 02/26/2022 CLINICAL DATA:  Vaginal spotting, positive pregnancy test EXAM: OBSTETRIC <14 WK ULTRASOUND TECHNIQUE: Transabdominal ultrasound was performed for evaluation of the gestation as well as the maternal uterus and adnexal regions. COMPARISON:  None Available. FINDINGS: Intrauterine gestational sac: Present Yolk sac:  Absent Embryo:  Present Cardiac Activity: Present Heart Rate: 158 bpm CRL:   52.5 mm   11 w 6 d                  [place] EDC: 09/10/2022 Subchorionic hemorrhage:  None visualized. Maternal uterus/adnexae: Ovaries are well visualized. Corpus luteum cyst is noted on the left. IMPRESSION: Single live intrauterine gestation at 11 weeks 6 days. Electronically Signed   By: Inez Catalina M.D.   On: 02/26/2022 00:00    MAU Management/MDM: I have reviewed the triage vital signs and the nursing notes.   Pertinent labs & imaging results that were available during my care of the patient were reviewed by me and considered in my medical decision making (see chart for details).      I have reviewed her medical records including past results, notes and treatments.   Discussed with pt and husband that there is currently no fresh bleeding.  Since cervix is closed and long and not friable, bleeding may be coming from a small [facility] we cannot see on [place] (US done yesterday here).  Unclear what source of bleeding is or why she had one episode of red bleeding when all I see is  brown mucous.  I told them it is reassuring that the cervix is long and closed and that we can doppler the FHR tonight.  Recommend followup with Dr Benjie Karvonen and asking her which parameters she recommends they come in for.    ASSESSMENT Single IUP at 73w0dHistory of incompetent cervix Vaginal bleeding, only old blood on exam  PLAN Discharge home Pelvic rest Reassurance given that things are stable for now Recommend touch base with DR MBenjie Karvonento discuss plan  Pt stable at time of discharge. Encouraged to return here if she develops worsening of symptoms, increase in pain, fever, or other concerning symptoms.    MHansel FeinsteinCNM, MSN Certified Nurse-Midwife 02/27/2022  4:51 AM

## 2022-03-03 ENCOUNTER — Encounter: Payer: Self-pay | Admitting: Obstetrics & Gynecology

## 2022-03-03 ENCOUNTER — Other Ambulatory Visit: Payer: Self-pay | Admitting: Obstetrics & Gynecology

## 2022-03-03 DIAGNOSIS — Z3689 Encounter for other specified antenatal screening: Secondary | ICD-10-CM

## 2022-03-04 ENCOUNTER — Ambulatory Visit: Payer: Managed Care, Other (non HMO) | Attending: Obstetrics & Gynecology

## 2022-03-04 ENCOUNTER — Encounter: Payer: Self-pay | Admitting: *Deleted

## 2022-03-04 ENCOUNTER — Ambulatory Visit: Payer: Managed Care, Other (non HMO) | Admitting: *Deleted

## 2022-03-04 VITALS — BP 130/64 | HR 85

## 2022-03-04 DIAGNOSIS — O3431 Maternal care for cervical incompetence, first trimester: Secondary | ICD-10-CM | POA: Diagnosis present

## 2022-03-04 DIAGNOSIS — Z3689 Encounter for other specified antenatal screening: Secondary | ICD-10-CM | POA: Insufficient documentation

## 2022-03-10 ENCOUNTER — Ambulatory Visit (HOSPITAL_COMMUNITY): Payer: Managed Care, Other (non HMO) | Admitting: Anesthesiology

## 2022-03-10 ENCOUNTER — Other Ambulatory Visit: Payer: Self-pay

## 2022-03-10 ENCOUNTER — Ambulatory Visit (HOSPITAL_COMMUNITY)
Admission: RE | Admit: 2022-03-10 | Discharge: 2022-03-10 | Disposition: A | Discharge status: Home / Self Care | Payer: Managed Care, Other (non HMO) | Attending: Obstetrics & Gynecology | Admitting: Obstetrics & Gynecology

## 2022-03-10 ENCOUNTER — Encounter (HOSPITAL_COMMUNITY): Payer: Self-pay | Admitting: Obstetrics & Gynecology

## 2022-03-10 ENCOUNTER — Encounter (HOSPITAL_COMMUNITY)
Admission: RE | Disposition: A | Discharge status: Home / Self Care | Payer: Self-pay | Source: Home / Self Care | Attending: Obstetrics & Gynecology

## 2022-03-10 DIAGNOSIS — O3431 Maternal care for cervical incompetence, first trimester: Secondary | ICD-10-CM

## 2022-03-10 DIAGNOSIS — Z3A13 13 weeks gestation of pregnancy: Secondary | ICD-10-CM

## 2022-03-10 HISTORY — PX: CERVICAL CERCLAGE: SHX1329

## 2022-03-10 LAB — CBC
HCT: 36.7 % (ref 36.0–46.0)
Hemoglobin: 12.6 g/dL (ref 12.0–15.0)
MCH: 29.4 pg (ref 26.0–34.0)
MCHC: 34.3 g/dL (ref 30.0–36.0)
MCV: 85.5 fL (ref 80.0–100.0)
Platelets: 204 10*3/uL (ref 150–400)
RBC: 4.29 MIL/uL (ref 3.87–5.11)
RDW: 12.8 % (ref 11.5–15.5)
WBC: 10.2 10*3/uL (ref 4.0–10.5)
nRBC: 0 % (ref 0.0–0.2)

## 2022-03-10 SURGERY — CERCLAGE, CERVIX, VAGINAL APPROACH
Anesthesia: Spinal

## 2022-03-10 MED ORDER — POVIDONE-IODINE 10 % EX SWAB
2.0000 | Freq: Once | CUTANEOUS | Status: AC
  Administered 2022-03-10: 2 via TOPICAL

## 2022-03-10 MED ORDER — OXYCODONE HCL 5 MG PO TABS: 5.0000 mg | ORAL_TABLET | Freq: Once | ORAL | Status: DC | PRN

## 2022-03-10 MED ORDER — BUPIVACAINE IN DEXTROSE 0.75-8.25 % IT SOLN
INTRATHECAL | Status: DC | PRN
  Administered 2022-03-10: 1.2 mL via INTRATHECAL

## 2022-03-10 MED ORDER — PHENYLEPHRINE HCL-NACL 20-0.9 MG/250ML-% IV SOLN
INTRAVENOUS | Status: AC
  Filled 2022-03-10: qty 250

## 2022-03-10 MED ORDER — PHENYLEPHRINE 80 MCG/ML (10ML) SYRINGE FOR IV PUSH (FOR BLOOD PRESSURE SUPPORT)
PREFILLED_SYRINGE | INTRAVENOUS | Status: AC
  Filled 2022-03-10: qty 10

## 2022-03-10 MED ORDER — CEFAZOLIN SODIUM-DEXTROSE 2-4 GM/100ML-% IV SOLN
2.0000 g | INTRAVENOUS | Status: AC
  Administered 2022-03-10: 2 g via INTRAVENOUS

## 2022-03-10 MED ORDER — ONDANSETRON HCL 4 MG/2ML IJ SOLN: 4.0000 mg | Freq: Four times a day (QID) | INTRAMUSCULAR | Status: DC | PRN

## 2022-03-10 MED ORDER — ACETAMINOPHEN 325 MG PO TABS
650.0000 mg | ORAL_TABLET | Freq: Four times a day (QID) | ORAL | Status: DC | PRN
Start: 2022-03-10 — End: 2022-09-01

## 2022-03-10 MED ORDER — DEXAMETHASONE SODIUM PHOSPHATE 4 MG/ML IJ SOLN
INTRAMUSCULAR | Status: AC
  Filled 2022-03-10: qty 2

## 2022-03-10 MED ORDER — ONDANSETRON HCL 4 MG/2ML IJ SOLN
INTRAMUSCULAR | Status: AC
  Filled 2022-03-10: qty 2

## 2022-03-10 MED ORDER — EPHEDRINE 5 MG/ML INJ
INTRAVENOUS | Status: AC
  Filled 2022-03-10: qty 5

## 2022-03-10 MED ORDER — FENTANYL CITRATE (PF) 100 MCG/2ML IJ SOLN: 25.0000 ug | INTRAMUSCULAR | Status: DC | PRN

## 2022-03-10 MED ORDER — ONDANSETRON HCL 4 MG/2ML IJ SOLN
INTRAMUSCULAR | Status: DC | PRN
  Administered 2022-03-10: 4 mg via INTRAVENOUS

## 2022-03-10 MED ORDER — OXYCODONE HCL 5 MG/5ML PO SOLN: 5.0000 mg | Freq: Once | ORAL | Status: DC | PRN

## 2022-03-10 MED ORDER — LACTATED RINGERS IV SOLN: INTRAVENOUS | Status: DC

## 2022-03-10 MED ORDER — CEFAZOLIN SODIUM-DEXTROSE 2-4 GM/100ML-% IV SOLN
INTRAVENOUS | Status: AC
  Filled 2022-03-10: qty 100

## 2022-03-10 SURGICAL SUPPLY — 17 items
CANISTER SUCT 3000ML PPV (MISCELLANEOUS) ×2 IMPLANT
GLOVE BIO SURGEON STRL SZ7 (GLOVE) ×2 IMPLANT
GLOVE BIOGEL PI IND STRL 7.0 (GLOVE) ×1 IMPLANT
GLOVE BIOGEL PI INDICATOR 7.0 (GLOVE) ×1
GOWN STRL REUS W/TWL LRG LVL3 (GOWN DISPOSABLE) ×4 IMPLANT
NDL MAYO CATGUT SZ4 TPR NDL (NEEDLE) IMPLANT
NEEDLE MAYO CATGUT SZ4 (NEEDLE) IMPLANT
NS IRRIG 1000ML POUR BTL (IV SOLUTION) ×2 IMPLANT
PACK VAGINAL MINOR WOMEN LF (CUSTOM PROCEDURE TRAY) ×2 IMPLANT
PAD OB MATERNITY 4.3X12.25 (PERSONAL CARE ITEMS) ×2 IMPLANT
PAD PREP 24X48 CUFFED NSTRL (MISCELLANEOUS) ×2 IMPLANT
SUT PROLENE 1 CT 1 30 (SUTURE) ×2 IMPLANT
SYR BULB IRRIGATION 50ML (SYRINGE) ×2 IMPLANT
TOWEL OR 17X24 6PK STRL BLUE (TOWEL DISPOSABLE) ×4 IMPLANT
TRAY FOLEY W/BAG SLVR 14FR (SET/KITS/TRAYS/PACK) ×2 IMPLANT
TUBING NON-CON 1/4 X 20 CONN (TUBING) ×2 IMPLANT
YANKAUER SUCT BULB TIP NO VENT (SUCTIONS) ×2 IMPLANT

## 2022-03-10 NOTE — H&P (Signed)
Maria Murray is an 36 y.o. female. G2P0100 13.4 wks with incompetent cervix, here for elective cervical cerclage.  Large [place]. Saw MFM last week, has reduced a lot. Bleeding minimal now. Taking Prometrium, continue to 16 wks per MFM AMA- NIPT low risk, sono- early anatomy nl   No LMP recorded. Patient is pregnant.    Past Medical History:  Diagnosis Date   Anemia     Past Surgical History:  Procedure Laterality Date   NO PAST SURGERIES      Family History  Problem Relation Age of Onset   Hyperlipidemia Mother    Diabetes Father    Diabetes Maternal Grandmother    Diabetes Maternal Grandfather    Diabetes Paternal Grandmother    Diabetes Paternal Grandfather     Social History:  reports that she has never smoked. She has never used smokeless tobacco. She reports that she does not currently use alcohol. She reports that she does not use drugs.  Allergies: No Known Allergies  Medications Prior to Admission  Medication Sig Dispense Refill Last Dose   docusate sodium (COLACE) 100 MG capsule Take 100 mg by mouth 2 (two) times daily.   03/09/2022 at 1700   Prenatal Vit-Fe Fumarate-FA (PRENATAL MULTIVITAMIN) TABS tablet Take 1 tablet by mouth daily.   03/09/2022   progesterone (PROMETRIUM) 100 MG capsule Take 100 mg by mouth at bedtime.   03/09/2022 at 2200    Review of Systems neg  Blood pressure 106/66, pulse 66, temperature 97.9 F (36.6 C), temperature source Oral, resp. rate 16, height 5' 6.93" (1.7 m), weight 82.6 kg, SpO2 100 %. Physical Exam Physical exam:  A&O x 3, no acute distress. Pleasant HEENT neg, no thyromegaly Lungs CTA bilat CV RRR, S1S2 normal Abdo soft, non tender, non acute Extr no edema/ tenderness Pelvic def to OR FHT  150s   Results for orders placed or performed during the hospital encounter of 03/10/22 (from the past 24 hour(s))  CBC     Status: None   Collection Time: 03/10/22  7:57 AM  Result Value Ref Range   WBC 10.2 4.0 - 10.5 K/uL   RBC  4.29 3.87 - 5.11 MIL/uL   Hemoglobin 12.6 12.0 - 15.0 g/dL   HCT 36.7 36.0 - 46.0 %   MCV 85.5 80.0 - 100.0 fL   MCH 29.4 26.0 - 34.0 pg   MCHC 34.3 30.0 - 36.0 g/dL   RDW 12.8 11.5 - 15.5 %   Platelets 204 150 - 400 K/uL   nRBC 0.0 0.0 - 0.2 %    No results found.  Assessment/Plan: Incompetent cervix. 13.4 wks. Here for elective [employer] cerclage. Risks/complications of surgery reviewed incl infection, bleeding, damage to internal organs including bladder, bowels, ureters, blood vessels, other risks from anesthesia, VTE and delayed complications of any surgery, complications in future surgery reviewed.     Elveria Royals 03/10/2022, 9:22 AM

## 2022-03-10 NOTE — Anesthesia Preprocedure Evaluation (Signed)
Anesthesia Evaluation  Patient identified by MRN, date of birth, ID band Patient awake    Reviewed: Allergy & Precautions, H&P , NPO status , Patient's Chart, lab work & pertinent test results  Airway Mallampati: II   Neck ROM: full    Dental   Pulmonary neg pulmonary ROS,    breath sounds clear to auscultation       Cardiovascular negative cardio ROS   Rhythm:regular Rate:Normal     Neuro/Psych    GI/Hepatic   Endo/Other    Renal/GU      Musculoskeletal   Abdominal   Peds  Hematology   Anesthesia Other Findings   Reproductive/Obstetrics (+) Pregnancy                             Anesthesia Physical Anesthesia Plan  ASA: 2  Anesthesia Plan: Spinal   Post-op Pain Management:    Induction: Intravenous  PONV Risk Score and Plan: 2 and Treatment may vary due to age or medical condition  Airway Management Planned: Nasal Cannula  Additional Equipment:   Intra-op Plan:   Post-operative Plan:   Informed Consent: I have reviewed the patients History and Physical, chart, labs and discussed the procedure including the risks, benefits and alternatives for the proposed anesthesia with the patient or authorized representative who has indicated his/her understanding and acceptance.     Dental advisory given  Plan Discussed with: CRNA, Anesthesiologist and Surgeon  Anesthesia Plan Comments:         Anesthesia Quick Evaluation

## 2022-03-10 NOTE — Transfer of Care (Signed)
Immediate Anesthesia Transfer of Care Note  Patient: Maria Murray  Procedure(s) Performed: Sherryle Lis CERVICAL CERCLAGE  Patient Location: PACU  Anesthesia Type:Spinal  Level of Consciousness: awake  Airway & Oxygen Therapy: Patient Spontanous Breathing  Post-op Assessment: Report given to RN and Post -op Vital signs reviewed and stable  Post vital signs: Reviewed and stable  Last Vitals:  Vitals Value Taken Time  BP 98/59 03/10/22 1015  Temp    Pulse 64 03/10/22 1021  Resp 16 03/10/22 1021  SpO2 100 % 03/10/22 1021  Vitals shown include unvalidated device data.  Last Pain:  Vitals:   03/10/22 0748  TempSrc: Oral         Complications: No notable events documented.

## 2022-03-10 NOTE — Op Note (Signed)
03/10/2022 Franc[facility] Found   Procedure: [employer] elective cervical cerclage   Pre-op diagnosis Incompetent cervix, 13.4 wks  Post-op diagnosis: same Surgeon: Azucena Fallen MD Anesthesia: Spinal  IVFluids: 1400 cc LR  EBL 5 cc  Urine: 600 cc clear in foley Complications none   Procedure:  Informed written consent was obtained for Cervical Cerclage after reviewing risks/ complications and future risk of infection/ miscarriage/ PROM and preterm delivery and patient understands and agreed.   Patient was brought to the operating room with IV running. She received preop 2 gm Ancef. She underwent Spinal anesthesia without complications. She was given dorsolithotomy position. Parts were prepped and draped in standard fashion. Bladder was catheterized with foley catheter and drained 600 cc urine. Speculum was placed and cervix was evaluated. External os appeared normal and closed and cervix appeared short (2 cm length). Bladder reflection noted at cervicovaginal junction. [employer] cerclage performed using 0- Prolene with purse-string stitch, starting at 1 o'clock and going in anti-clock direction taking regular purse string stitch while grasping cervix with ring forceps. Knot tied at 1 o'clock. Bleeding from needle entry- exit sites stopped after stitch was tied down. Post operative cervical length clinically about 2.5 cm  Hemostasis excellent. Instruments removed. All counts correct x2.  Patient will be discharged home today. Warning signs of infection and excessive bleeding and miscarriage precautions reviewed.  I performed this surgery.   -Azucena Fallen, MD.   Erling Conte Obgyn

## 2022-03-10 NOTE — Anesthesia Procedure Notes (Signed)
Spinal  Patient location during procedure: OR Start time: 03/10/2022 9:31 AM End time: 03/10/2022 9:33 AM Reason for block: surgical anesthesia Staffing Performed: anesthesiologist  Anesthesiologist: Albertha Ghee, MD Performed by: Albertha Ghee, MD Authorized by: Albertha Ghee, MD   Preanesthetic Checklist Completed: patient identified, IV checked, risks and benefits discussed, surgical consent, monitors and equipment checked, pre-op evaluation and timeout performed Spinal Block Patient position: sitting Prep: DuraPrep Patient monitoring: cardiac monitor, continuous pulse ox and blood pressure Approach: midline Location: L3-4 Injection technique: single-shot Needle Needle type: Pencan  Needle gauge: 24 G Needle length: 9 cm Assessment Sensory level: T10 Events: CSF return Additional Notes Functioning IV was confirmed and monitors were applied. Sterile prep and drape, including hand hygiene and sterile gloves were used. The patient was positioned and the spine was prepped. The skin was anesthetized with lidocaine.  Free flow of clear CSF was obtained prior to injecting local anesthetic into the CSF.  The spinal needle aspirated freely following injection.  The needle was carefully withdrawn.  The patient tolerated the procedure well.

## 2022-03-11 NOTE — Anesthesia Postprocedure Evaluation (Signed)
Anesthesia Post Note  Patient: Maria Murray  Procedure(s) Performed: [place]     Patient location during evaluation: PACU Anesthesia Type: Spinal Level of consciousness: oriented and awake and alert Pain management: pain level controlled Vital Signs Assessment: post-procedure vital signs reviewed and stable Respiratory status: spontaneous breathing, respiratory function stable and patient connected to nasal cannula oxygen Cardiovascular status: blood pressure returned to baseline and stable Postop Assessment: no headache, no backache and no apparent nausea or vomiting Anesthetic complications: no   No notable events documented.  Last Vitals:  Vitals:   03/10/22 1215 03/10/22 1331  BP: 106/60 109/80  Pulse: 76 69  Resp: 13 20  Temp:  36.9 C  SpO2: 100%     Last Pain:  Vitals:   03/10/22 1331  TempSrc: Oral                 Maria Murray S

## 2022-04-08 ENCOUNTER — Other Ambulatory Visit: Payer: Self-pay | Admitting: Obstetrics & Gynecology

## 2022-04-08 DIAGNOSIS — Z3689 Encounter for other specified antenatal screening: Secondary | ICD-10-CM

## 2022-04-20 ENCOUNTER — Ambulatory Visit: Payer: Managed Care, Other (non HMO) | Admitting: *Deleted

## 2022-04-20 ENCOUNTER — Other Ambulatory Visit: Payer: Self-pay | Admitting: Obstetrics & Gynecology

## 2022-04-20 ENCOUNTER — Encounter: Payer: Self-pay | Admitting: *Deleted

## 2022-04-20 ENCOUNTER — Ambulatory Visit: Payer: Managed Care, Other (non HMO) | Attending: Obstetrics & Gynecology

## 2022-04-20 VITALS — BP 116/58 | HR 79

## 2022-04-20 DIAGNOSIS — O3431 Maternal care for cervical incompetence, first trimester: Secondary | ICD-10-CM | POA: Diagnosis present

## 2022-04-20 DIAGNOSIS — Z3689 Encounter for other specified antenatal screening: Secondary | ICD-10-CM

## 2022-07-16 LAB — HEPATITIS C ANTIBODY: HCV Ab: NEGATIVE

## 2022-07-18 IMAGING — US US MFM OB LIMITED
1 series · 14 of 28 positions shown · non-contrast
Comparison: none

[Series 1: us mfm ob limited · 34 acquisitions, 14 frames shown]
[im 2/34]
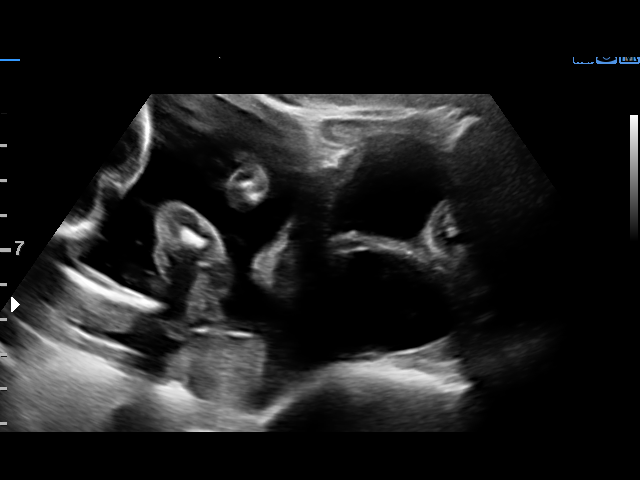
[im 4/34]
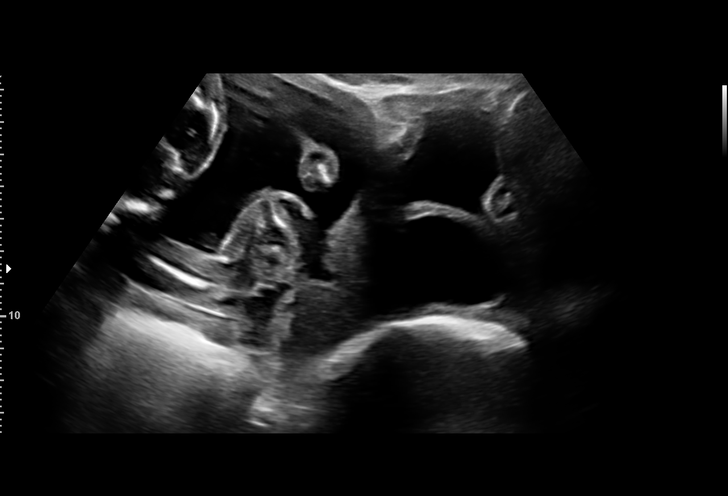
[im 7/34]
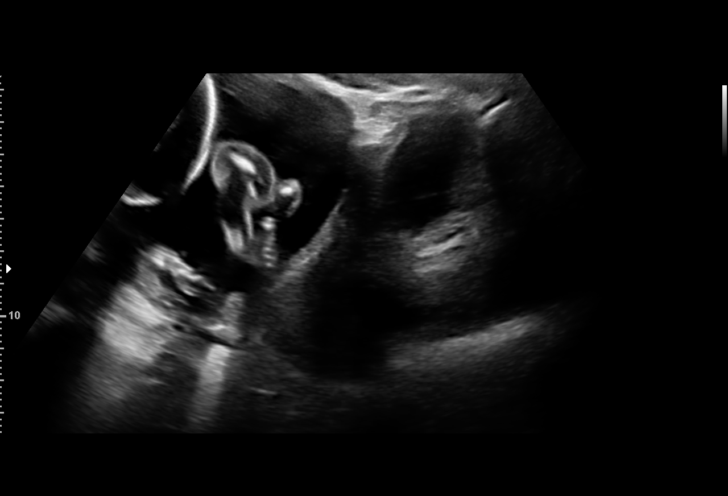
[im 9/34]
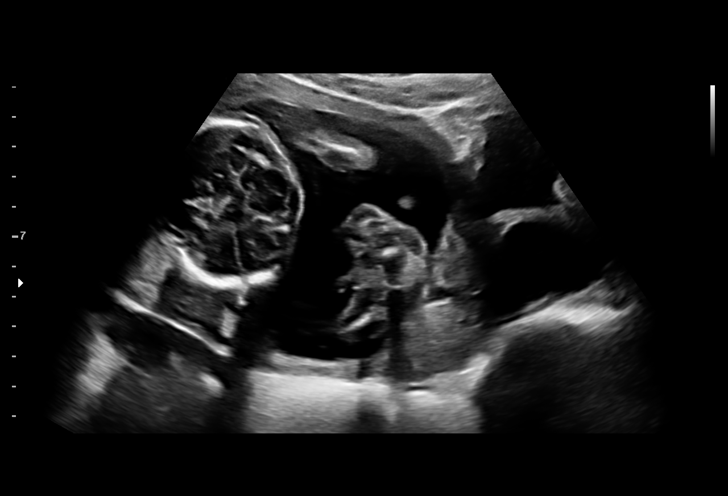
[im 12/34]
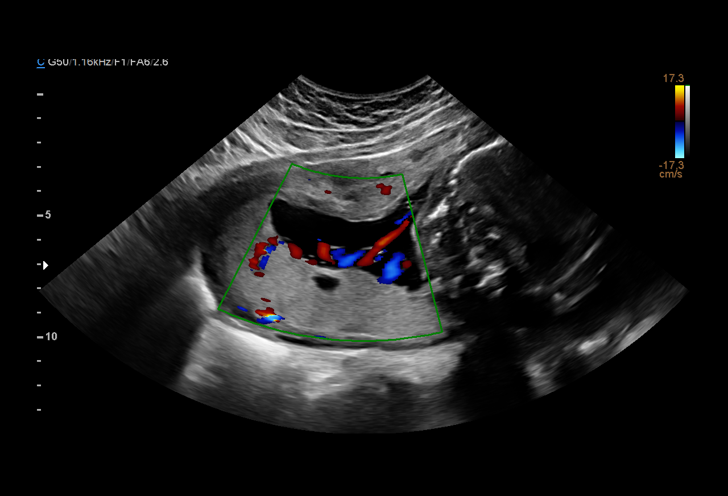
[im 14/34]
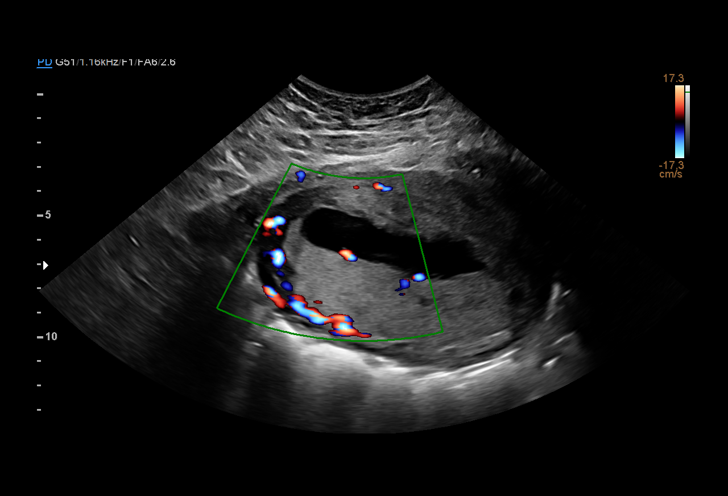
[im 16/34]
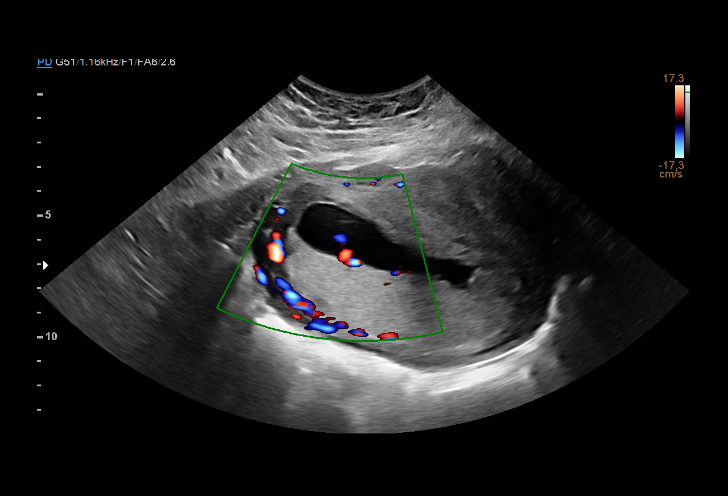
[im 19/34]
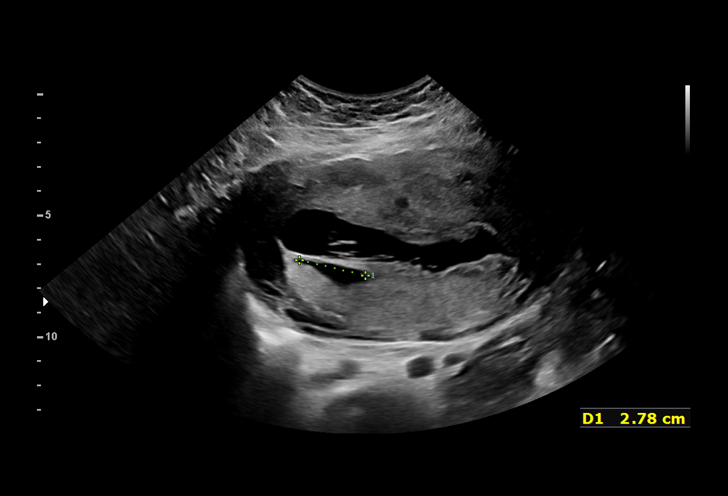
[im 21/34]
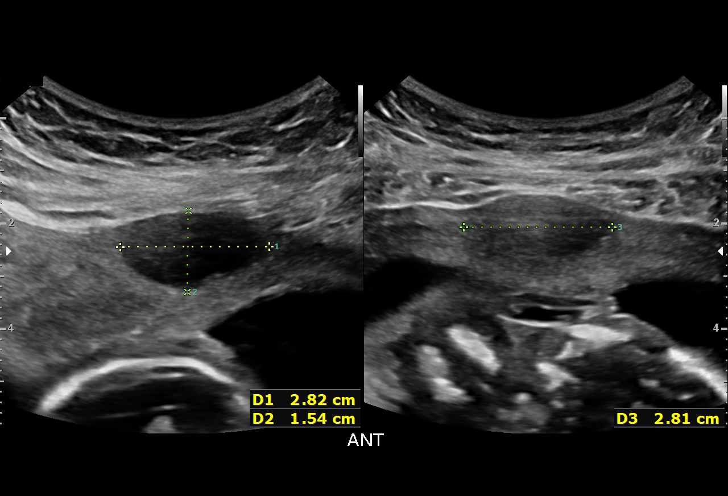
[im 24/34]
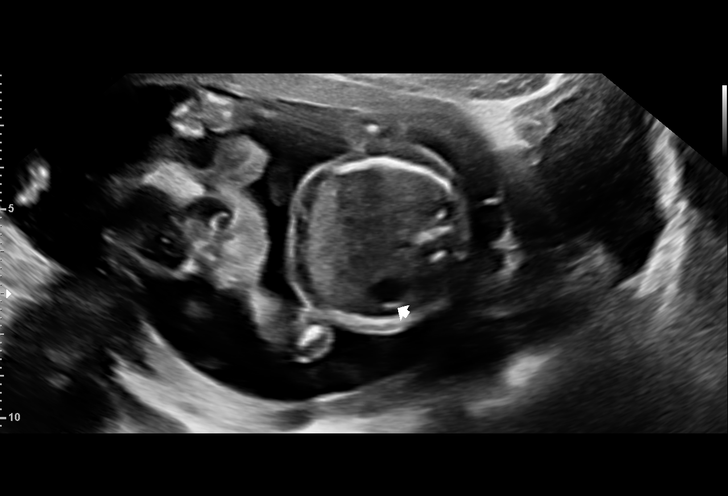
[im 26/34]
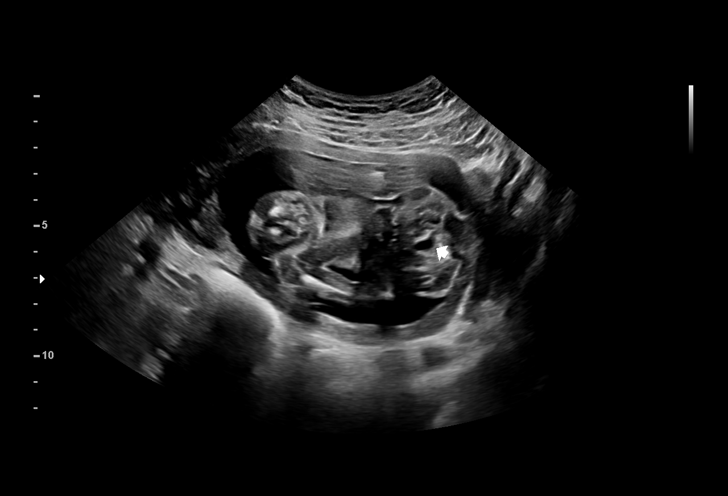
[im 29/34]
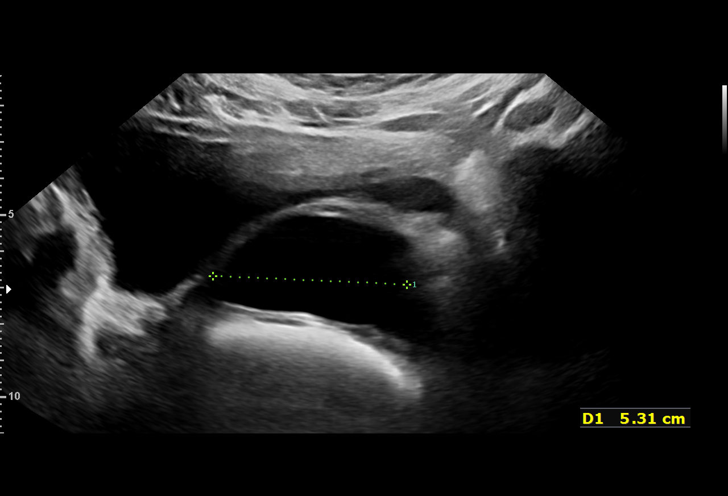
[im 31/34]
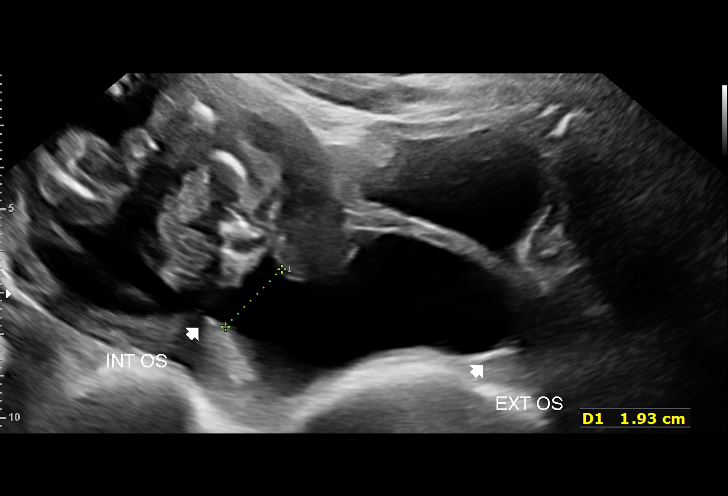
[im 34/34]
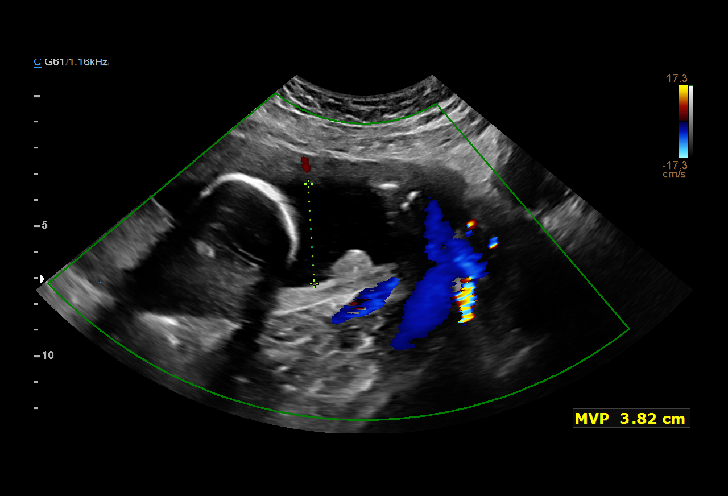

[14 of 28 positions shown; findings below may reference images not displayed]

Attending:        Vanique Cordelia        Secondary Phy.:   [facility]
                                                            [REDACTED]
                                                            Ave[address] [HOSPITAL]
                   Care                                     [HOSPITAL]

Indications

 Vaginal discharge during pregnancy in
 second trimester
 Cervical insufficiency
 20 weeks gestation of pregnancy
Fetal Evaluation

 Num Of Fetuses:         1
 Fetal Heart Rate(bpm):  140
 Cardiac Activity:       Observed
 Presentation:           Breech
 Placenta:               Posterior
 P. Cord Insertion:      Visualized

 Amniotic Fluid
 AFI FV:      Within normal limits

                             Largest Pocket(cm)

Biometry

 LV:        4.7  mm
OB History

 Gravidity:    1
Gestational Age
 LMP:           20w 5d        Date:  09/19/20                 EDD:   06/26/21
 Best:          20w 5d     Det. By:  LMP  (09/19/20)          EDD:   06/26/21
Anatomy

 Cranium:               Appears normal         Diaphragm:              Appears normal
 Ventricles:            Appears normal         Stomach:                Appears normal, left
                                                                       sided
 Thoracic:              Appears normal         Bladder:                Appears normal
Cervix Uterus Adnexa

 Cervix
 Length:           3.46  cm.
 Dilated Appears dilated, see comments.
Myomas

 Site                     L(cm)      W(cm)      D(cm)       Location
 Anterior

 Blood Flow                  RI       PI       Comments

Impression

 G1 P0 at 20w 2d gestation was admitted yesterday for rescue
 cerclage. On office ultrasound, the cervix (external os) was 3
 cm dilated and the membranes were visible.
 Today, the patient noticed bright red blood per vaginum. No
 clots were passed.
 A limited ultrasound study showed normal amniotic fluid.
 Good fetal activity is seen. On transabdominal scan, the
 cervical canal is completely dilated and the extent of
 membrane prolapse into the vagina cannot be determined.
Recommendations

 -Examination before or under anesthesia to determine if
 rescue cerclage can be performed.
                 Tiger, Alia

## 2022-07-25 IMAGING — US US MFM OB LIMITED
1 series · 15 of 20 positions shown · non-contrast
Comparison: none

1  US MFM OB LIMITED                     76815.01    ABDIELYMARTITA CERRADO

Indications
 21 weeks gestation of pregnancy
 Premature rupture of membranes - leaking
 fluid
 Vaginal bleeding in pregnancy, second
 trimester
Fetal Evaluation
 Num Of Fetuses:         1
 Fetal Heart Rate(bpm):  160
 Cardiac Activity:       Observed
 Presentation:           Cephalic
 Placenta:               Posterior
 Amniotic Fluid
 AFI FV:      Within normal limits
                             Largest Pocket(cm)
OB History
 Gravidity:    1
Gestational Age
 LMP:           21w 5d        Date:  09/19/20                 EDD:   06/26/21
 Best:          21w 5d     Det. By:  LMP  (09/19/20)          EDD:   06/26/21
Impression
 Patient was evaluated for c/o vaginal bleeding. Ultrasound
 was requested to confirm presentation and amniotic fluid.
 A limited ultrasound study was performed .Amniotic fluid is
 normal. Good fetal heart activity is seen.  Cephalic
 presentation.  The head appears to be in the vagina.

[Series 1: us mfm ob limited · 20 acquisitions, 15 frames shown]
[im 1/20]
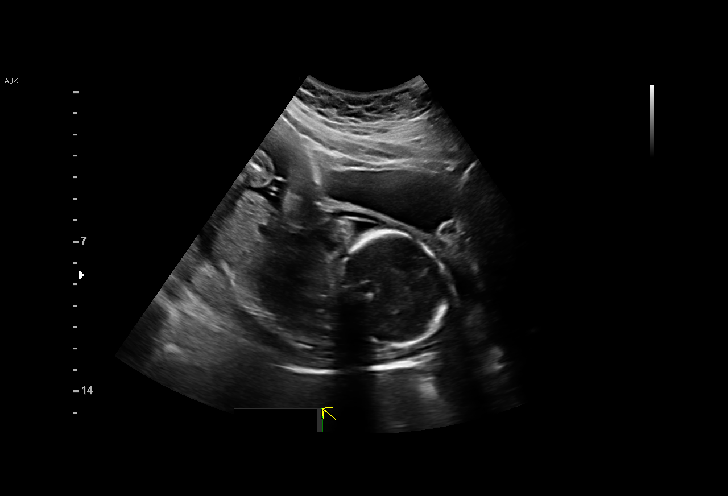
[im 3/20]
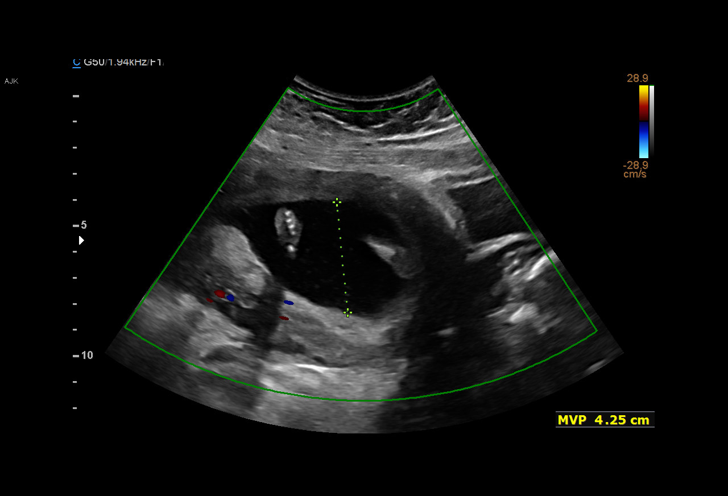
[im 4/20]
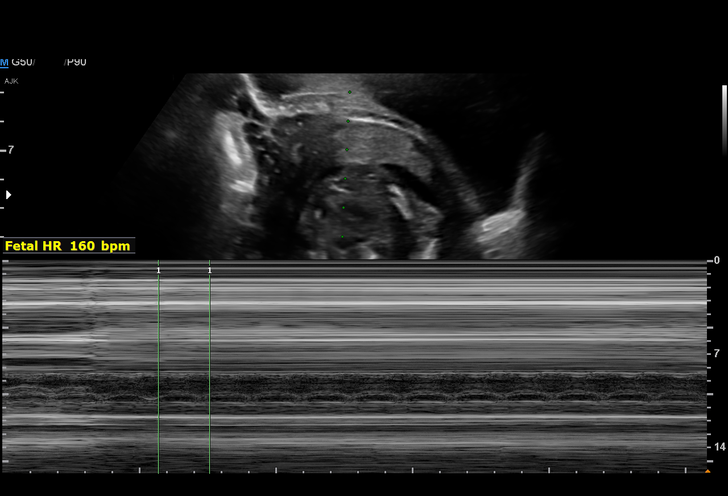
[im 5/20]
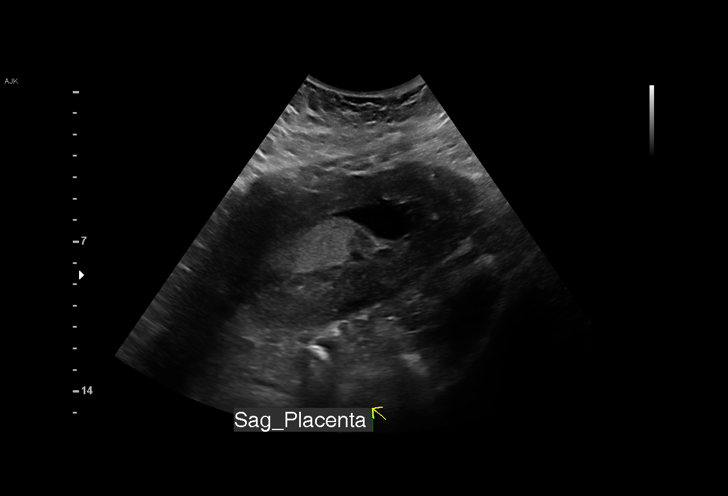
[im 7/20]
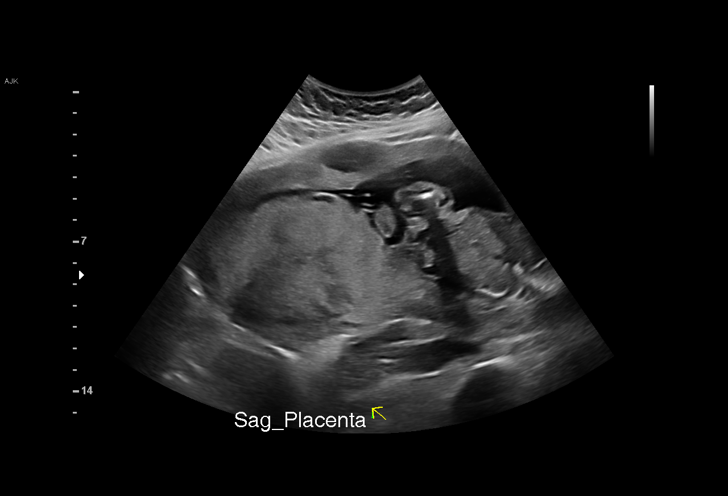
[im 8/20]
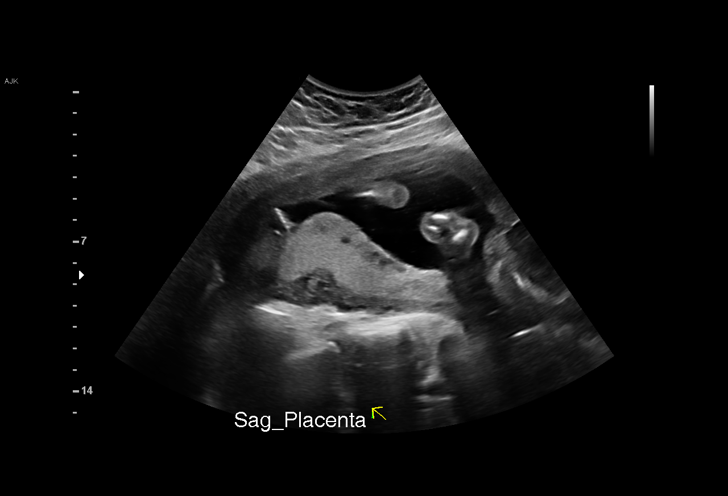
[im 9/20]
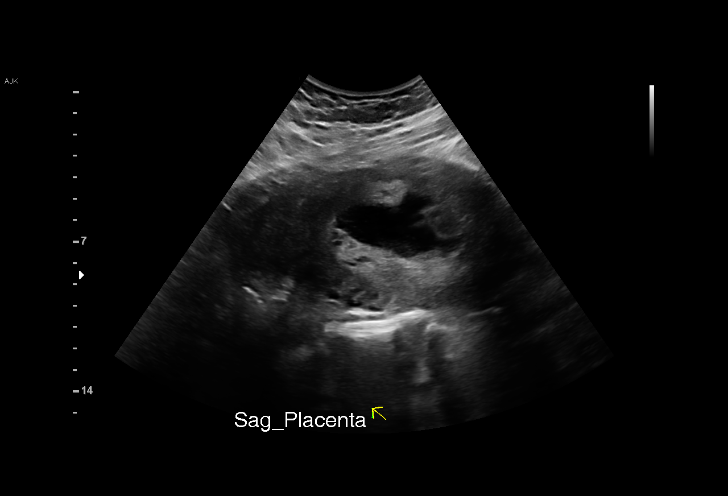
[im 11/20]
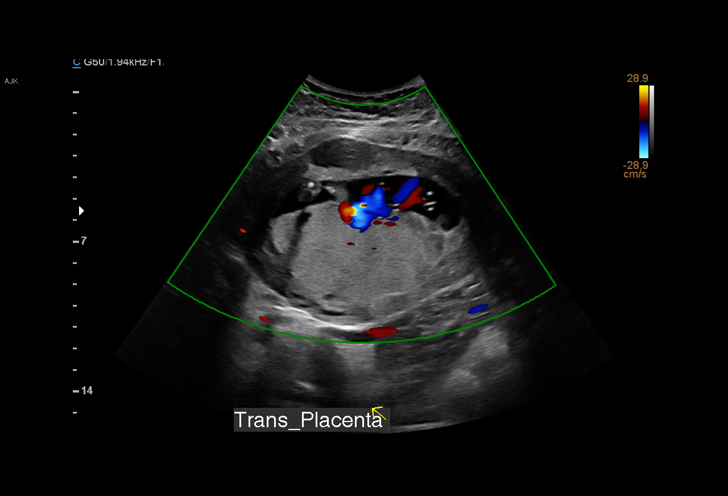
[im 12/20]
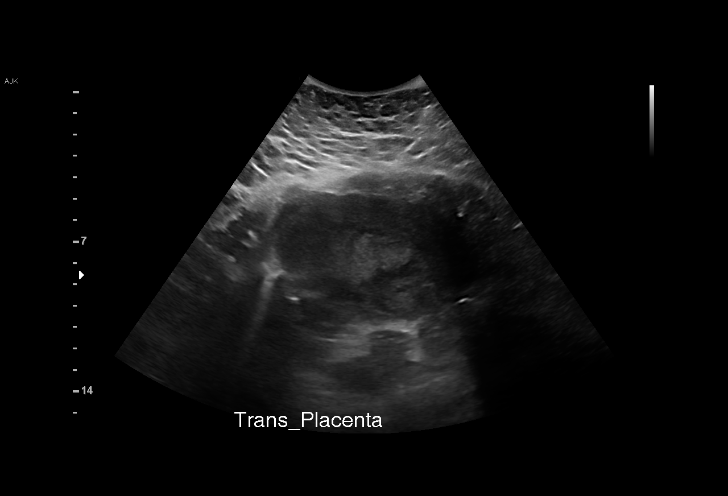
[im 13/20]
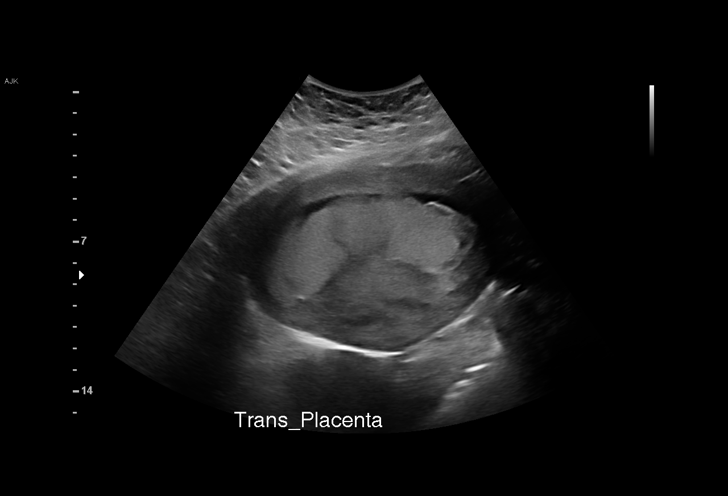
[im 15/20]
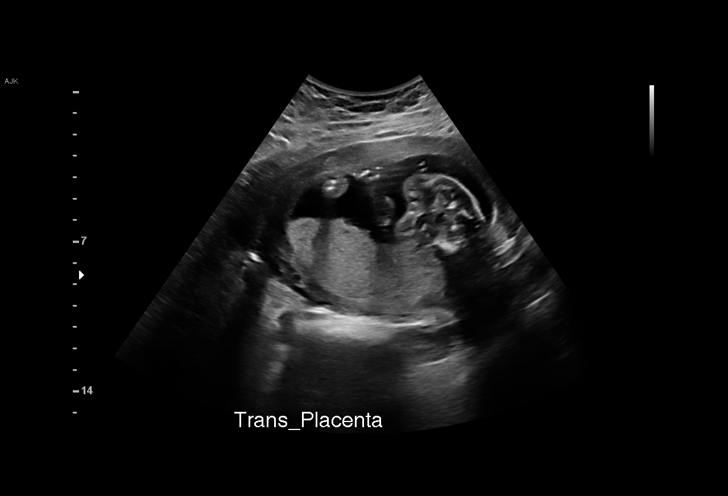
[im 16/20]
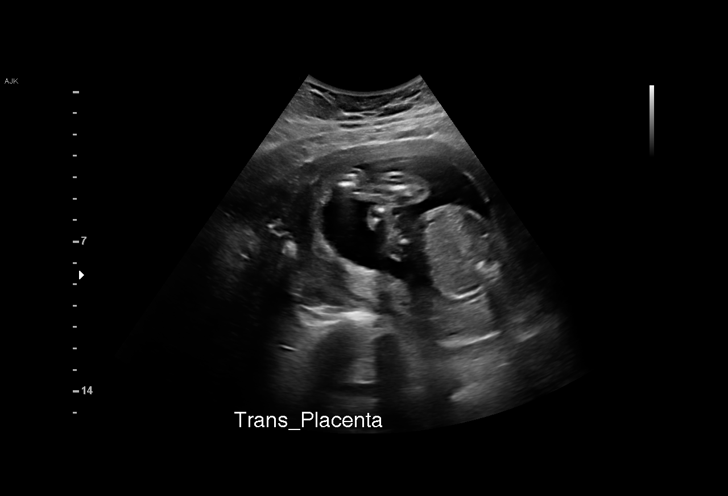
[im 17/20]
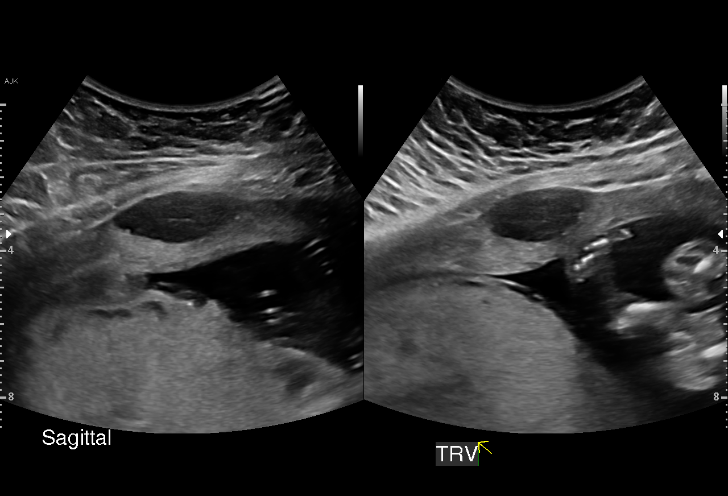
[im 19/20]
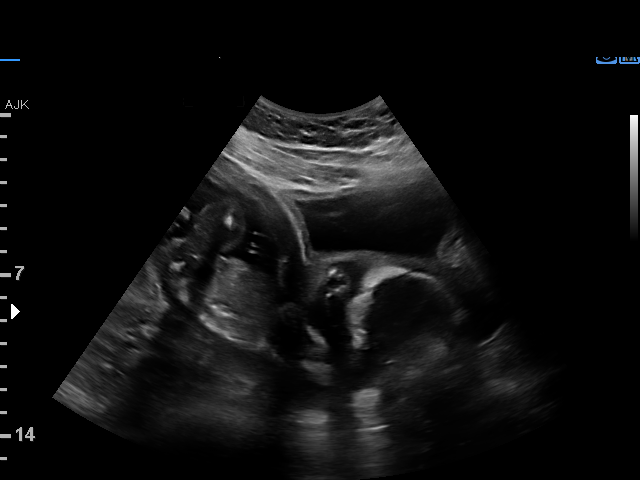
[im 20/20]
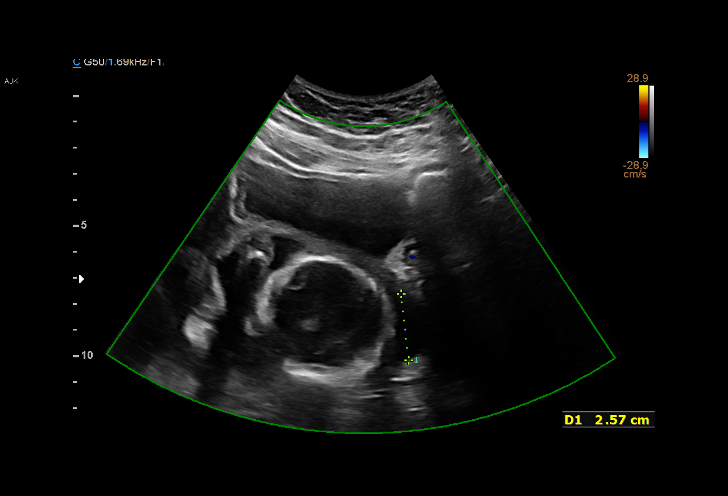

[15 of 20 positions shown; findings below may reference images not displayed]

IMPRESSION: Inevitable miscarriage.
                 Jalbert, Enemecio

## 2022-08-14 LAB — OB RESULTS CONSOLE GBS: GBS: NEGATIVE

## 2022-08-24 NOTE — L&D Delivery Note (Signed)
Delivery Note At 6:00 AM a viable and healthy female was delivered via Vaginal, Spontaneous (Presentation: Right Occiput Anterior).  APGAR: 9, 10; weight  .   Placenta status: Spontaneous, Intact.  Cord: 3 vessels with the following complications: None.  Cord pH: na  Anesthesia: Local Episiotomy: None Lacerations: 2nd degree Suture Repair: 2.0 3.0 vicryl Est. Blood Loss (mL): 330  Mom to postpartum.  Baby to Couplet care / Skin to Skin.  Sollie Vultaggio J 08/31/2022, 6:42 AM

## 2022-08-31 ENCOUNTER — Encounter (HOSPITAL_COMMUNITY): Payer: Self-pay | Admitting: Obstetrics and Gynecology

## 2022-08-31 ENCOUNTER — Other Ambulatory Visit: Payer: Self-pay

## 2022-08-31 ENCOUNTER — Inpatient Hospital Stay (HOSPITAL_COMMUNITY)
Admission: AD | Admit: 2022-08-31 | Discharge: 2022-09-03 | DRG: 807 | Disposition: A | Discharge status: Home / Self Care | Payer: Managed Care, Other (non HMO) | Attending: Obstetrics and Gynecology | Admitting: Obstetrics and Gynecology

## 2022-08-31 DIAGNOSIS — O9902 Anemia complicating childbirth: Secondary | ICD-10-CM | POA: Diagnosis present

## 2022-08-31 DIAGNOSIS — R030 Elevated blood-pressure reading, without diagnosis of hypertension: Secondary | ICD-10-CM | POA: Diagnosis not present

## 2022-08-31 DIAGNOSIS — O99892 Other specified diseases and conditions complicating childbirth: Secondary | ICD-10-CM | POA: Diagnosis not present

## 2022-08-31 DIAGNOSIS — Z3A38 38 weeks gestation of pregnancy: Secondary | ICD-10-CM

## 2022-08-31 DIAGNOSIS — O26893 Other specified pregnancy related conditions, third trimester: Secondary | ICD-10-CM | POA: Diagnosis present

## 2022-08-31 HISTORY — DX: Maternal care for cervical incompetence, unspecified trimester: O34.30

## 2022-08-31 LAB — CBC
HCT: 39.6 % (ref 36.0–46.0)
Hemoglobin: 12.6 g/dL (ref 12.0–15.0)
MCH: 27 pg (ref 26.0–34.0)
MCHC: 31.8 g/dL (ref 30.0–36.0)
MCV: 85 fL (ref 80.0–100.0)
Platelets: 227 10*3/uL (ref 150–400)
RBC: 4.66 MIL/uL (ref 3.87–5.11)
RDW: 15.1 % (ref 11.5–15.5)
WBC: 9.2 10*3/uL (ref 4.0–10.5)
nRBC: 0 % (ref 0.0–0.2)

## 2022-08-31 LAB — TYPE AND SCREEN
ABO/RH(D): O POS
Antibody Screen: NEGATIVE

## 2022-08-31 LAB — POCT FERN TEST

## 2022-08-31 LAB — RPR: RPR Ser Ql: NONREACTIVE

## 2022-08-31 MED ORDER — DIPHENHYDRAMINE HCL 25 MG PO CAPS: 25.0000 mg | ORAL_CAPSULE | Freq: Four times a day (QID) | ORAL | Status: DC | PRN

## 2022-08-31 MED ORDER — PRENATAL MULTIVITAMIN CH
1.0000 | ORAL_TABLET | Freq: Every day | ORAL | Status: DC
  Filled 2022-08-31 (×3): qty 1

## 2022-08-31 MED ORDER — DIBUCAINE (PERIANAL) 1 % EX OINT: 1.0000 | TOPICAL_OINTMENT | CUTANEOUS | Status: DC | PRN

## 2022-08-31 MED ORDER — OXYTOCIN-SODIUM CHLORIDE 30-0.9 UT/500ML-% IV SOLN
2.5000 [IU]/h | INTRAVENOUS | Status: DC
  Filled 2022-08-31: qty 500

## 2022-08-31 MED ORDER — ACETAMINOPHEN 325 MG PO TABS: 650.0000 mg | ORAL_TABLET | ORAL | Status: DC | PRN

## 2022-08-31 MED ORDER — SIMETHICONE 80 MG PO CHEW: 80.0000 mg | CHEWABLE_TABLET | ORAL | Status: DC | PRN

## 2022-08-31 MED ORDER — ONDANSETRON HCL 4 MG/2ML IJ SOLN: 4.0000 mg | Freq: Four times a day (QID) | INTRAMUSCULAR | Status: DC | PRN

## 2022-08-31 MED ORDER — IBUPROFEN 600 MG PO TABS
600.0000 mg | ORAL_TABLET | Freq: Four times a day (QID) | ORAL | Status: DC
  Administered 2022-09-02 – 2022-09-03 (×5): 600 mg via ORAL
  Filled 2022-08-31 (×10): qty 1

## 2022-08-31 MED ORDER — WITCH HAZEL-GLYCERIN EX PADS
1.0000 | MEDICATED_PAD | CUTANEOUS | Status: DC | PRN
  Administered 2022-09-01: 1 via TOPICAL

## 2022-08-31 MED ORDER — BENZOCAINE-MENTHOL 20-0.5 % EX AERO
1.0000 | INHALATION_SPRAY | CUTANEOUS | Status: DC | PRN
  Administered 2022-08-31: 1 via TOPICAL
  Filled 2022-08-31 (×2): qty 56

## 2022-08-31 MED ORDER — SOD CITRATE-CITRIC ACID 500-334 MG/5ML PO SOLN: 30.0000 mL | ORAL | Status: DC | PRN

## 2022-08-31 MED ORDER — SENNOSIDES-DOCUSATE SODIUM 8.6-50 MG PO TABS
2.0000 | ORAL_TABLET | Freq: Every day | ORAL | Status: DC
  Administered 2022-09-01 – 2022-09-03 (×3): 2 via ORAL
  Filled 2022-08-31 (×3): qty 2

## 2022-08-31 MED ORDER — LACTATED RINGERS IV SOLN: INTRAVENOUS | Status: DC

## 2022-08-31 MED ORDER — ONDANSETRON HCL 4 MG/2ML IJ SOLN: 4.0000 mg | INTRAMUSCULAR | Status: DC | PRN

## 2022-08-31 MED ORDER — TETANUS-DIPHTH-ACELL PERTUSSIS 5-2.5-18.5 LF-MCG/0.5 IM SUSY: 0.5000 mL | PREFILLED_SYRINGE | Freq: Once | INTRAMUSCULAR | Status: DC

## 2022-08-31 MED ORDER — LIDOCAINE HCL (PF) 1 % IJ SOLN
30.0000 mL | INTRAMUSCULAR | Status: AC | PRN
  Administered 2022-08-31: 30 mL via SUBCUTANEOUS
  Filled 2022-08-31: qty 30

## 2022-08-31 MED ORDER — LACTATED RINGERS IV SOLN: 500.0000 mL | INTRAVENOUS | Status: DC | PRN

## 2022-08-31 MED ORDER — FENTANYL CITRATE (PF) 100 MCG/2ML IJ SOLN: 50.0000 ug | INTRAMUSCULAR | Status: DC | PRN

## 2022-08-31 MED ORDER — COCONUT OIL OIL: 1.0000 | TOPICAL_OIL | Status: DC | PRN

## 2022-08-31 MED ORDER — ACETAMINOPHEN 325 MG PO TABS
650.0000 mg | ORAL_TABLET | ORAL | Status: DC | PRN
  Administered 2022-09-01: 650 mg via ORAL
  Filled 2022-08-31: qty 2

## 2022-08-31 MED ORDER — ZOLPIDEM TARTRATE 5 MG PO TABS: 5.0000 mg | ORAL_TABLET | Freq: Every evening | ORAL | Status: DC | PRN

## 2022-08-31 MED ORDER — ONDANSETRON HCL 4 MG PO TABS: 4.0000 mg | ORAL_TABLET | ORAL | Status: DC | PRN

## 2022-08-31 MED ORDER — OXYCODONE-ACETAMINOPHEN 5-325 MG PO TABS: 2.0000 | ORAL_TABLET | ORAL | Status: DC | PRN

## 2022-08-31 MED ORDER — OXYCODONE-ACETAMINOPHEN 5-325 MG PO TABS: 1.0000 | ORAL_TABLET | ORAL | Status: DC | PRN

## 2022-08-31 MED ORDER — OXYTOCIN BOLUS FROM INFUSION
333.0000 mL | Freq: Once | INTRAVENOUS | Status: AC
  Administered 2022-08-31: 333 mL via INTRAVENOUS

## 2022-08-31 MED ORDER — METHYLERGONOVINE MALEATE 0.2 MG/ML IJ SOLN: 0.2000 mg | INTRAMUSCULAR | Status: DC | PRN

## 2022-08-31 MED ORDER — METHYLERGONOVINE MALEATE 0.2 MG PO TABS: 0.2000 mg | ORAL_TABLET | ORAL | Status: DC | PRN

## 2022-08-31 NOTE — Lactation Note (Addendum)
This note was copied from a baby's chart. Lactation Consultation Note  Patient Name: Maria Murray FSFSE'L Date: 08/31/2022 Reason for consult: Initial assessment Age:36 hours  P1, Baby is tongue thrusting and having difficulty sustaining latch.  Recommend prepumping and hand expressing before latch.  Baby comes off and on nipple but did have a few intermittent sustained sucking bursts.  Assisted in various positions helping maintain depth.   Feed on demand with cues.  Goal 8-12+ times per day after first 24 hrs.  Place baby STS if not cueing.  Mom made aware of O/P services, breastfeeding support groups, community resources, and our phone # for post-discharge questions.  Suggest calling for help today as needed.   Maternal Data Has patient been taught Hand Expression?: Yes Does the patient have breastfeeding experience prior to this delivery?: No  Feeding Mother's Current Feeding Choice: Breast Milk  LATCH Score Latch: Repeated attempts needed to sustain latch, nipple held in mouth throughout feeding, stimulation needed to elicit sucking reflex.  Audible Swallowing: A few with stimulation  Type of Nipple: Everted at rest and after stimulation  Comfort (Breast/Nipple): Soft / non-tender  Hold (Positioning): Assistance needed to correctly position infant at breast and maintain latch.  LATCH Score: 7   Lactation Tools Discussed/Used Tools: Pump Breast pump type: Manual  Interventions Interventions: Breast feeding basics reviewed;Assisted with latch;Skin to skin;Hand express;Pre-pump if needed;Hand pump;Education;LC Services brochure   Consult Status Consult Status: Follow-up Date: 09/01/22 Follow-up type: In-patient    [name] [facility] 08/31/2022, 9:05 AM

## 2022-08-31 NOTE — H&P (Signed)
Maria Murray is a 37 y.o. female presenting for labor. OB History     Gravida  2   Para  1   Term  0   Preterm  1   AB  0   Living  0      SAB  0   IAB  0   Ectopic  0   Multiple  0   Live Births  0          Past Medical History:  Diagnosis Date   Anemia    Incompetent cervix in pregnancy    removed the 22nd of December   Past Surgical History:  Procedure Laterality Date   CERVICAL CERCLAGE N/A 03/10/2022   Procedure: McDonald CERVICAL CERCLAGE;  Surgeon: Azucena Fallen, MD;  Location: [facility] LD ORS;  Service: Gynecology;  Laterality: N/A;  EDD: 09/11/22 Requests 45 min.   NO PAST SURGERIES     Family History: family history includes Diabetes in her father, maternal grandfather, maternal grandmother, paternal grandfather, and paternal grandmother; Hyperlipidemia in her maternal grandfather, mother, and paternal grandfather; Hypertension in her father. Social History:  reports that she has never smoked. She has never used smokeless tobacco. She reports that she does not currently use alcohol. She reports that she does not use drugs.     Maternal Diabetes: No Genetic Screening: Normal Maternal Ultrasounds/Referrals: Normal Fetal Ultrasounds or other Referrals:  None Maternal Substance Abuse:  No Significant Maternal Medications:  None Significant Maternal Lab Results:  Group B Strep negative Number of Prenatal Visits:greater than 3 verified prenatal visits Other Comments:  None  Review of Systems  Constitutional: Negative.   All other systems reviewed and are negative.  Maternal Medical History:  Reason for admission: Contractions.   Contractions: Onset was 1-2 hours ago.   Frequency: irregular.   Perceived severity is moderate.   Fetal activity: Last perceived fetal movement was within the past hour.   Prenatal Complications - Diabetes: none.   Dilation: 10 Effacement (%): 100 Station: Plus 2 Exam by:: Amy Peterman, RN Blood pressure 130/65,  pulse (!) 102, temperature 98 F (36.7 C), temperature source Oral, resp. rate 18, height '5\' 7"'$  (1.702 m), weight 99.3 kg, SpO2 98 %. Maternal Exam:  Uterine Assessment: Contraction strength is moderate.  Contraction frequency is irregular.  Abdomen: Patient reports no abdominal tenderness. Fetal presentation: vertex Introitus: Normal vulva. Normal vagina.  Ferning test: positive.  Nitrazine test: positive. Amniotic fluid character: not assessed.   Physical Exam Constitutional:      Appearance: Normal appearance. She is normal weight.  HENT:     Head: Normocephalic and atraumatic.  Cardiovascular:     Rate and Rhythm: Normal rate and regular rhythm.  Pulmonary:     Effort: Pulmonary effort is normal.     Breath sounds: Normal breath sounds.  Abdominal:     General: Bowel sounds are normal.     Palpations: Abdomen is soft.  Genitourinary:    General: Normal vulva.  Musculoskeletal:        General: Normal range of motion.     Cervical back: Normal range of motion and neck supple.  Skin:    General: Skin is warm and dry.  Neurological:     General: No focal deficit present.     Mental Status: She is alert.  Psychiatric:        Mood and Affect: Mood normal.        Behavior: Behavior normal.     Prenatal labs: ABO, Rh: --/--/  O POS (01/08 0344) Antibody: NEG (01/08 0344) Rubella: Immune (06/23 0000) RPR:    HBsAg: Negative (06/23 0000)  HIV: Non-reactive (06/23 0000)  GBS: Negative/-- (12/22 0000)   Assessment/Plan: Term iUP in labor History of Cervical insufficiency Admit   Shara Hartis J 08/31/2022, 6:37 AM

## 2022-08-31 NOTE — MAU Note (Signed)
Pt deferred SVE prefers vertex confirmation with ultrasound, pt approved Dr.Crenzso to perform bedside ultrasound in LD.

## 2022-08-31 NOTE — Lactation Note (Signed)
This note was copied from a baby's chart. Lactation Consultation Note  Patient Name: Boy Angelli Baruch ZCHYI'F Date: 08/31/2022 Reason for consult: Follow-up assessment;1st time breastfeeding;Early term 37-38.6wks Age:36 hours  P1, Returned to room to assist with latching.   Baby was placed skin to skin on mother/s chest.  Reviewed hand expression and gave baby drops on spoon. Had mother prepump with hand pump bilaterally since baby is not sustaining latch. Suggest calling for help as needed.  Maternal Data Does the patient have breastfeeding experience prior to this delivery?: No  Feeding Mother's Current Feeding Choice: Breast Milk  LATCH Score Latch: Repeated attempts needed to sustain latch, nipple held in mouth throughout feeding, stimulation needed to elicit sucking reflex.  Audible Swallowing: A few with stimulation  Type of Nipple: Everted at rest and after stimulation  Comfort (Breast/Nipple): Soft / non-tender  Hold (Positioning): Assistance needed to correctly position infant at breast and maintain latch.  LATCH Score: 7   Lactation Tools Discussed/Used Tools: Pump Breast pump type: Manual  Interventions Interventions: Assisted with latch;Skin to skin;Hand express;Hand pump;Education  Discharge    Consult Status Consult Status: Follow-up Date: 09/01/22 Follow-up type: In-patient    [name] [facility] 08/31/2022, 3:12 PM

## 2022-08-31 NOTE — MAU Note (Signed)
.  Maria Murray is a 37 y.o. at 25w3dhere in MAU reporting: SROM at 0052, she reports that the fluid is clear with some "yellowish-green specs". She is also having ctx (5/10), that are every 4-5 minutes apart. Denies active vaginal bleeding, reports bloody show. She last felt FM around 0110, but has not felt movement since.  Hx of incompetent cervix with cerclage removal in December. SVE: 1cm in December.  LMP: N/A Onset of complaint: today Pain score: 5/10 Vitals:   08/31/22 0230  BP: 119/86  Pulse: 92  Resp: 17  Temp: 98.2 F (36.8 C)  SpO2: 98%     FHT:134 Lab orders placed from triage:  N/A

## 2022-09-01 LAB — CBC
HCT: 34 % — ABNORMAL LOW (ref 36.0–46.0)
Hemoglobin: 11.4 g/dL — ABNORMAL LOW (ref 12.0–15.0)
MCH: 28.4 pg (ref 26.0–34.0)
MCHC: 33.5 g/dL (ref 30.0–36.0)
MCV: 84.6 fL (ref 80.0–100.0)
Platelets: 220 10*3/uL (ref 150–400)
RBC: 4.02 MIL/uL (ref 3.87–5.11)
RDW: 15.2 % (ref 11.5–15.5)
WBC: 17.1 10*3/uL — ABNORMAL HIGH (ref 4.0–10.5)
nRBC: 0 % (ref 0.0–0.2)

## 2022-09-01 NOTE — Social Work (Signed)
CSW received consult for Check in. Hx of fetal demise at 21 weeks last pregnancy, noted to be very anxious throughout pregnancy per prenatal record. CSW met with MOB to offer support and complete assessment.  CSW entered the room introduced self, CSW role and reason for visit. MOB was agreeable to visit. CSW inquired about how MOB was feeling, MOB reported good and the delivery was fast but good. MOB expressed feeling relieved now that the infant was here and healthy.   CSW inquired about resources needed, MOB reported they are prepared with necessary items such as diapers, wipes, car seat and a crib and bassinet for the infant to sleep. CSW provided education regarding the baby blues period vs. perinatal mood disorders, discussed treatment and gave resources for mental health follow up if concerns arise.  CSW recommends self-evaluation during the postpartum time period using the New Mom Checklist from Postpartum Progress and encouraged MOB to contact a medical professional if symptoms are noted at any time. MOB reported having good support form her mom and spouse.    CSW provided review of Sudden Infant Death Syndrome (SIDS) precautions. MOB identified [place] for infants follow up.  CSW identifies no further need for intervention and no barriers to discharge at this time.  Maria Murray, [place] Social Worker [phone]

## 2022-09-01 NOTE — Lactation Note (Signed)
This note was copied from a baby's chart. Lactation Consultation Note  Patient Name: Maria Murray SWNIO'E Date: 09/01/2022 Reason for consult: Follow-up assessment;Difficult latch;Primapara;Early term 37-38.6wks Age:36 hours Mom having difficulty latching. Baby screaming unable to latch. LC held baby to calm him. Got mom to use hand pump to pre-pump nipple to evert. Attempted to latch. Baby started crying again. LC attempted suck training w/gloved finger. Noted narrow high palate.baby wouldn't suckle on finger. Just kept crying. Put baby on shoulder swaddled, patted and rocked. Got baby to finally calm down.  Reverse pressure to areola to soften breast tissue not helpful. Fitted #24 NS. Got baby to latch and finally stopped crying. When he finally started suckling he sucked strong and hard for 3 minutes continuously. Then stopped and wouldn't start back. Fell asleep. Placed in bed after swaddle. Baby slept about 4 minutes then cry. Dad held baby and he stopped crying. Worked w/mom on pumping. Collected 2 ml colostrum. LC spoon fed baby colostrum. Baby fell back asleep.  Gave mom shells to wear in am to soften breast tissue in am. Suggested either pre-pump and try latching to nipple or use NS to feed baby and post-pump afterwards. Give baby back any colostrum collected. Call [facility] for assistance today.  Maternal Data Has patient been taught Hand Expression?: Yes Does the patient have breastfeeding experience prior to this delivery?: No  Feeding    LATCH Score Latch: Repeated attempts needed to sustain latch, nipple held in mouth throughout feeding, stimulation needed to elicit sucking reflex.  Audible Swallowing: None  Type of Nipple: Everted at rest and after stimulation (short shaft)  Comfort (Breast/Nipple): Filling, red/small blisters or bruises, mild/mod discomfort (edema)  Hold (Positioning): Full assist, staff holds infant at breast  LATCH Score: 4   Lactation Tools  Discussed/Used Tools: Shells;Pump;Nipple Shields Nipple shield size: 20;24 Breast pump type: Double-Electric Breast Pump;Manual Pump Education: Setup, frequency, and cleaning;Milk Storage Reason for Pumping: short shaft/edema Pumping frequency: q3hr Pumped volume: 2 mL  Interventions Interventions: Breast feeding basics reviewed;Adjust position;DEBP;Assisted with latch;Support pillows;Skin to skin;Position options;Breast massage;Expressed milk;Hand express;Pre-pump if needed;Shells;Reverse pressure;Breast compression  Discharge    Consult Status Consult Status: Follow-up Date: 09/01/22 Follow-up type: In-patient    Terrace Fontanilla, [place]ta [place] 09/01/2022, 2:04 AM

## 2022-09-01 NOTE — Lactation Note (Addendum)
This note was copied from a baby's chart. Lactation Consultation Note  Patient Name: Boy Zoya Sprecher INOMV'E Date: 09/01/2022 Reason for consult: Follow-up assessment Age:36 hours  P1, Infant's feedings are improving.  Noted baby showing feeding cues.  Applied #20 nipple shield. Baby opened, latched and began rhythmical sucking pattern. Baby was able to sustain latch for 20 min with #20NS. After breastfeeding baby was supplemented with 20 ml of formula using purple extra slow flow nipple.  Demonstrated paced feeding and reviewed volume guidelines. Mother plans to pump after feeding and give volume back to baby at next feeding.  24 mm flanges seem appropriate at this time. Plan is for baby to breast, supplement with mother's milk and then formula based on demand and guidelines.  Suggest calling for help today as needed.  Maternal Data Does the patient have breastfeeding experience prior to this delivery?: No  Feeding Mother's Current Feeding Choice: Breast Milk and Formula  LATCH Score Latch: Grasps breast easily, tongue down, lips flanged, rhythmical sucking.  Audible Swallowing: A few with stimulation  Type of Nipple: Everted at rest and after stimulation  Comfort (Breast/Nipple): Soft / non-tender  Hold (Positioning): Assistance needed to correctly position infant at breast and maintain latch.  LATCH Score: 8   Lactation Tools Discussed/Used Tools: Nipple Shields Nipple shield size: 20 Breast pump type: Double-Electric Breast Pump Reason for Pumping: stimulation and supplementation  Interventions Interventions: Assisted with latch;Skin to skin;Hand express;DEBP  Discharge Pump: Personal;DEBP  Consult Status Consult Status: Follow-up Date: 09/02/22 Follow-up type: In-patient    [name] [facility] 09/01/2022, 11:00 AM

## 2022-09-01 NOTE — Progress Notes (Signed)
No c/o; pain controlled, voids w/o difficulty Ambulating some Breastfeeding  Patient Vitals for the past 24 hrs:  BP Temp Temp src Pulse Resp SpO2  09/01/22 0553 121/79 (!) 97.5 F (36.4 C) Oral (!) 108 18 99 %  08/31/22 2238 128/76 -- -- (!) 115 -- --  08/31/22 2110 137/75 98 F (36.7 C) -- (!) 110 18 96 %  08/31/22 1700 126/74 98.6 F (37 C) Oral (!) 112 16 100 %  08/31/22 1300 120/70 97.9 F (36.6 C) Oral (!) 103 16 99 %   A&ox3 Nml respirations Abd: soft,nt,nd; fundus firm and below umb LE: no edema, nt bilat     Latest Ref Rng & Units 09/01/2022    8:09 AM 08/31/2022    3:45 AM 03/10/2022    7:57 AM  CBC  WBC 4.0 - 10.5 K/uL 17.1  9.2  10.2   Hemoglobin 12.0 - 15.0 g/dL 11.4  12.6  12.6   Hematocrit 36.0 - 46.0 % 34.0  39.6  36.7   Platelets 150 - 400 K/uL 220  227  204    A/P: ppd1 s/p svd Doing well, contin care; plan d/c home tomorrow RI Rh pos Mild anemia, abl - asymptomatic, iron rich foods Boy, breastfeeding- working on latch; no circ

## 2022-09-02 LAB — COMPREHENSIVE METABOLIC PANEL
ALT: 253 U/L — ABNORMAL HIGH (ref 0–44)
AST: 132 U/L — ABNORMAL HIGH (ref 15–41)
Albumin: 2.7 g/dL — ABNORMAL LOW (ref 3.5–5.0)
Alkaline Phosphatase: 88 U/L (ref 38–126)
Anion gap: 11 (ref 5–15)
BUN: 8 mg/dL (ref 6–20)
CO2: 23 mmol/L (ref 22–32)
Calcium: 8.8 mg/dL — ABNORMAL LOW (ref 8.9–10.3)
Chloride: 102 mmol/L (ref 98–111)
Creatinine, Ser: 0.84 mg/dL (ref 0.44–1.00)
GFR, Estimated: >60 mL/min (ref >60)
Glucose, Bld: 120 mg/dL — ABNORMAL HIGH (ref 70–99)
Potassium: 3.9 mmol/L (ref 3.5–5.1)
Sodium: 136 mmol/L (ref 135–145)
Total Bilirubin: 0.4 mg/dL (ref 0.3–1.2)
Total Protein: 6 g/dL — ABNORMAL LOW (ref 6.5–8.1)

## 2022-09-02 LAB — CBC
HCT: 32.1 % — ABNORMAL LOW (ref 36.0–46.0)
Hemoglobin: 10.6 g/dL — ABNORMAL LOW (ref 12.0–15.0)
MCH: 27.7 pg (ref 26.0–34.0)
MCHC: 33 g/dL (ref 30.0–36.0)
MCV: 83.8 fL (ref 80.0–100.0)
Platelets: 200 10*3/uL (ref 150–400)
RBC: 3.83 MIL/uL — ABNORMAL LOW (ref 3.87–5.11)
RDW: 15.4 % (ref 11.5–15.5)
WBC: 12.1 10*3/uL — ABNORMAL HIGH (ref 4.0–10.5)
nRBC: 0 % (ref 0.0–0.2)

## 2022-09-02 MED ORDER — IBUPROFEN 600 MG PO TABS: 600.0000 mg | ORAL_TABLET | Freq: Four times a day (QID) | ORAL | 0 refills | Status: DC

## 2022-09-02 MED ORDER — ACETAMINOPHEN 325 MG PO TABS: 650.0000 mg | ORAL_TABLET | ORAL | Status: DC | PRN

## 2022-09-02 MED ORDER — NIFEDIPINE ER OSMOTIC RELEASE 30 MG PO TB24
30.0000 mg | ORAL_TABLET | Freq: Every day | ORAL | Status: DC
  Administered 2022-09-02 – 2022-09-03 (×2): 30 mg via ORAL
  Filled 2022-09-02 (×2): qty 1

## 2022-09-02 NOTE — Lactation Note (Signed)
This note was copied from a baby's chart. Lactation Consultation Note  Patient Name: Maria Murray JKDTO'I Date: 09/02/2022 Age 36 hours old  Reason for consult: Follow-up assessment P1 , 1st time BF, 8 % weight loss , NP asked LC to assess feeding.  Baby due to feed. LC reviewed basics and baby latched on the left breast 5 mins after steps for latching. Swallows noted , increased with breast compressions.  LC noted areola edema - and recommended steps for latching to stretch the nipple / areola complex for a deeper latch.  Mom re-latched the baby in a laid back position same breast, and baby fed 3 mins with swallows. After 2  latches , LC recommended to go head to pace feed and showed dad how to pace feed and he did well. Baby took 28 ml of formula. Mom post pumped 14 ml.  LC reviewed breastfeeding D/C plans and provided a written LC step plan for .  LC plan : Feed the baby with feeding cues and by 3 hours if baby isn't awake feed the baby.  Breast shells while awake Steps for latching  / STS  Feed 15 - 20 mins , supplement 30 ml EBM or formula and post pump both breast 15 mins , save milk for the next feeding.  Per mom will be having a LC come to her house for assistance.   Maternal Data Has patient been taught Hand Expression?: Yes  Feeding Mother's Current Feeding Choice: Breast Milk and Formula  LATCH Score Latch: Grasps breast easily, tongue down, lips flanged, rhythmical sucking.  Audible Swallowing: Spontaneous and intermittent  Type of Nipple: Everted at rest and after stimulation (areola edema)  Comfort (Breast/Nipple): Filling, red/small blisters or bruises, mild/mod discomfort  Hold (Positioning): Assistance needed to correctly position infant at breast and maintain latch.  LATCH Score: 8   Lactation Tools Discussed/Used Tools: Shells;Pump;Flanges Nipple shield size: Other (comment) (did not use the NS , per mom sometimes it works and sometimes baby slides  off) Flange Size: 24 Breast pump type: Manual;Double-Electric Breast Pump Pump Education: Milk Storage  Interventions Interventions: Breast feeding basics reviewed;Assisted with latch;Skin to skin;Breast massage;Hand express;Pre-pump if needed;Reverse pressure;Breast compression;Adjust position;Support pillows;Position options;Shells;Hand pump;DEBP;Education;Pace feeding  Discharge Discharge Education: Engorgement and breast care;Warning signs for feeding baby;Outpatient recommendation;Other (comment) (per mom has a LC coming to her home) Pump: DEBP;Manual;Personal WIC Program: No  Consult Status Consult Status: Follow-up Date: 09/02/22 Follow-up type: In-patient    [place] 09/02/2022, 12:36 PM

## 2022-09-02 NOTE — Progress Notes (Signed)
Progress Note  Maria Murray 09-Dec-1985 [id number]  Patient is a 36Y [id number] PPD#2 s/p NSVD who had been cleared for discharge home this morning. She had an uncomplicated vaginal delivery and meeting all postpartum milestones. Received call from RN that patient had inadvertently had vitals done by the tech after discharge which were found to be elevated. Upon repeat the BP's remained elevated and patient and RN requested evaluation. I evaluated patient at bedside, she was resting comfortably in the chair. She denies any previous history of HTN during this pregnancy, in previous pregnancy, or outside of pregnancy. She reports feeling well, denies HA, vision changes, CP, SOB, RUQ or epigastric pain. She does endorse swelling in legs and on exam does have +1/+2 LE edema. Patient BP's reviewed from admission: she had one mild range elevation immediately after delivery. On PPD1 she had a single mild range elevation with remaining Bps 120s/60-70s. Today PPD2 AM BP 119/70 then at 1345 BP 148/97 > repeat 158/54 and repeat at 1724 BP 145/87.  Counseled patient on recommendation to remain inpatient tonight to start antihypertensives and continue to monitor. Patient and husband had many questions but agreeable to plan of care. [place] labs obtained and significant for elevated LFT's. Otherwise platelets, and creatinine wnl and urine protein deferred give PP status with lochia. No prior CMP to compare to baseline. Procardia 30XL given at 1818 and will titrate as needed. Continue BP monitoring and will repeat labs in AM. Holding Mag since patient has no neuro symptoms and BP's are non-severe. Low threshold to initiate if patient status changes.      09/02/2022    5:24 PM 09/02/2022    1:48 PM 09/02/2022    1:45 PM  Vitals with BMI  Systolic [phone]  Diastolic 87 54 97  Pulse 90 92 95      Latest Ref Rng & Units 09/02/2022    6:00 PM 09/01/2022    8:09 AM 08/31/2022    3:45 AM  CBC  WBC 4.0 - 10.5 K/uL 12.1  17.1   9.2   Hemoglobin 12.0 - 15.0 g/dL 10.6  11.4  12.6   Hematocrit 36.0 - 46.0 % 32.1  34.0  39.6   Platelets 150 - 400 K/uL 200  220  227       Latest Ref Rng & Units 09/02/2022    6:00 PM  CMP  Glucose 70 - 99 mg/dL 120   BUN 6 - 20 mg/dL 8   Creatinine 0.44 - 1.00 mg/dL 0.84   Sodium 135 - 145 mmol/L 136   Potassium 3.5 - 5.1 mmol/L 3.9   Chloride 98 - 111 mmol/L 102   CO2 22 - 32 mmol/L 23   Calcium 8.9 - 10.3 mg/dL 8.8   Total Protein 6.5 - 8.1 g/dL 6.0   Total Bilirubin 0.3 - 1.2 mg/dL 0.4   Alkaline Phos 38 - 126 U/L 88   AST 15 - 41 U/L 132   ALT 0 - 44 U/L 253     Keiry Kowal A Mccall Will 09/02/22 8:51 PM

## 2022-09-02 NOTE — Progress Notes (Signed)
Notified Dr. Lanny Cramp of Pt's elevated BPs. Pt was discharged earlier today but afternoon vitals revealed elevated BP with elevated repeats. PT never left her room and wanted MD notified before she leaves for home.

## 2022-09-02 NOTE — Progress Notes (Signed)
Post Partum Day 2, SVD, Boy. 2nd dgr perineal lac. H/o incompetent cervix and elective cerclage Subjective: Feels well, some LE swelling. Some swelling in perineal wound. Bleeding reduced. No depression/ anxiety. Happy.   Objective: Blood pressure 119/70, pulse 99, temperature 97.8 F (36.6 C), temperature source Oral, resp. rate 18, height '5\' 7"'$  (1.702 m), weight 99.3 kg, SpO2 99 %, unknown if currently breastfeeding. Patient Vitals for the past 24 hrs:  BP Temp Temp src Pulse Resp SpO2  09/02/22 0515 119/70 97.8 F (36.6 C) Oral 99 18 99 %  09/01/22 2123 129/68 98 F (36.7 C) Oral 97 19 98 %  09/01/22 1535 120/67 -- -- 100 20 98 %  09/01/22 1419 (!) 140/82 (!) 97.3 F (36.3 C) Oral (!) 113 18 99 %    Physical Exam:  General: alert Lochia: appropriate Uterine Fundus: firm Incision: healing well, no significant drainage DVT Evaluation: No evidence of DVT seen on physical exam. Negative Homan's sign.     Latest Ref Rng & Units 09/01/2022    8:09 AM 08/31/2022    3:45 AM 03/10/2022    7:57 AM  CBC  WBC 4.0 - 10.5 K/uL 17.1  9.2  10.2   Hemoglobin 12.0 - 15.0 g/dL 11.4  12.6  12.6   Hematocrit 36.0 - 46.0 % 34.0  39.6  36.7   Platelets 150 - 400 K/uL 220  227  204    Rh pos Rub Imm  Assessment/Plan: PPD #2. [id number], term SVD, Boy, breast feeding, no circ.  Doing well Anxiety from prior loss, none now., great support, warning ss dw pt  Pericare and PP care and reportable ss dw pt  Discharge today., f/up office 6 wks PP and sooner as needed    LOS: 2 days   Elveria Royals, MD 09/02/2022, 7:23 AM

## 2022-09-02 NOTE — Discharge Summary (Signed)
Postpartum Discharge Summary     Patient Name: Maria Murray DOB: October 02, 1985 MRN: [id number]  Date of admission: 08/31/2022 Delivery date:08/31/2022  Delivering provider: Brien Few  Date of discharge: 09/02/2022  Admitting diagnosis: Normal labor [O80, Z37.9] Intrauterine pregnancy: [redacted]w[redacted]d    Secondary diagnosis:  Principal Problem:   Normal labor  Additional problems: Incompetent cervix, elective cerclage at 13 wks, removed in office at 36 weeks     Discharge diagnosis: Term Pregnancy Delivered                                              Post partum procedures: None Augmentation:  None needed Complications: None  Hospital course: Onset of Labor With Vaginal Delivery      37y.o. yo G2P1101 at 314w3das admitted in Active Labor on 08/31/2022. Labor course was complicated by rapid labor.   Membrane Rupture Time/Date: 12:52 AM ,08/31/2022   Delivery Method:Vaginal, Spontaneous  Episiotomy: None  Lacerations:  2nd degree;Perineal  Patient had a postpartum course complicated by none.  She is ambulating, tolerating a regular diet, passing flatus, and urinating well. Patient is discharged home in stable condition on 09/02/22.  Newborn Data: Birth date:08/31/2022  Birth time:6:00 AM  Gender:Female  Living status:Living  Apgars:9 ,10  Weight:3040 g   Magnesium Sulfate received: no BMZ received: No Rhophylac:N/A MMR:NA T-DaP:Given prenatally Flu: Yes Transfusion:No  Physical exam  Vitals:   09/01/22 1419 09/01/22 1535 09/01/22 2123 09/02/22 0515  BP: (!) 140/82 120/67 129/68 119/70  Pulse: (!) 113 100 97 99  Resp: '18 20 19 18  '$ Temp: (!) 97.3 F (36.3 C)  98 F (36.7 C) 97.8 F (36.6 C)  TempSrc: Oral  Oral Oral  SpO2: 99% 98% 98% 99%  Weight:      Height:       General: alert, cooperative, and no distress Lochia: appropriate Uterine Fundus: firm Incision: Healing well with no significant drainage, No significant erythema DVT Evaluation: No evidence of DVT seen  on physical exam. Negative Homan's sign. Labs: Lab Results  Component Value Date   WBC 17.1 (H) 09/01/2022   HGB 11.4 (L) 09/01/2022   HCT 34.0 (L) 09/01/2022   MCV 84.6 09/01/2022   PLT 220 09/01/2022       No data to display         Edinburgh Score:     No data to display            After visit meds:  Allergies as of 09/02/2022   No Known Allergies      Medication List     TAKE these medications    acetaminophen 325 MG tablet Commonly known as: Tylenol Take 2 tablets (650 mg total) by mouth every 4 (four) hours as needed (for pain scale < 4).   DHA PO Take 1 capsule by mouth daily.   ibuprofen 600 MG tablet Commonly known as: ADVIL Take 1 tablet (600 mg total) by mouth every 6 (six) hours.   prenatal multivitamin Tabs tablet Take 1 tablet by mouth daily.         Discharge home in stable condition Infant Feeding: Bottle and Breast Infant Disposition:home with mother Discharge instruction: per After Visit Summary and Postpartum booklet. Activity: Advance as tolerated. Pelvic rest for 6 weeks.  Diet: routine diet Anticipated Birth Control: Unsure Postpartum Appointment:6 weeks Additional Postpartum  F/U:  as needed Future Appointments:No future appointments. Follow up Visit: Pt to call for 6 week f/up visit   09/02/2022 Elveria Royals, MD  Addendum:  Patient discharge was cancelled on 1/10 after having persistent elevated BP's after discharge had been placed. She was started on Procardia 30XL daily and had [place] labs drawn. Patient asymptomatic for PEC but did have labs significant for elevated LFT's. Her BP's normalized and remained asymptomatic this morning PPD#3. Repeat labs showed slight down trend of LFT's but otherwise labs WNL. Patient has no known history of abnormal liver enzymes in the past. Recommend serial outpatient labs to confirm normalization or additional evaluation as indicated, but does not need to remain inpatient since  otherwise normal pressures and asymptomatic. Plan of care reviewed with patient and plan to follow up in office early next week. BP parameters and call parameters given. Discharge home today in stable condition.  Maria Murray 09/03/22 1:11 PM

## 2022-09-03 LAB — COMPREHENSIVE METABOLIC PANEL
ALT: 258 U/L — ABNORMAL HIGH (ref 0–44)
AST: 127 U/L — ABNORMAL HIGH (ref 15–41)
Albumin: 2.8 g/dL — ABNORMAL LOW (ref 3.5–5.0)
Alkaline Phosphatase: 102 U/L (ref 38–126)
Anion gap: 13 (ref 5–15)
BUN: 11 mg/dL (ref 6–20)
CO2: 24 mmol/L (ref 22–32)
Calcium: 9.1 mg/dL (ref 8.9–10.3)
Chloride: 102 mmol/L (ref 98–111)
Creatinine, Ser: 0.75 mg/dL (ref 0.44–1.00)
GFR, Estimated: >60 mL/min (ref >60)
Glucose, Bld: 82 mg/dL (ref 70–99)
Potassium: 4.8 mmol/L (ref 3.5–5.1)
Sodium: 139 mmol/L (ref 135–145)
Total Bilirubin: 1 mg/dL (ref 0.3–1.2)
Total Protein: 6.3 g/dL — ABNORMAL LOW (ref 6.5–8.1)

## 2022-09-03 LAB — CBC
HCT: 34.3 % — ABNORMAL LOW (ref 36.0–46.0)
Hemoglobin: 11.1 g/dL — ABNORMAL LOW (ref 12.0–15.0)
MCH: 27.8 pg (ref 26.0–34.0)
MCHC: 32.4 g/dL (ref 30.0–36.0)
MCV: 86 fL (ref 80.0–100.0)
Platelets: 229 10*3/uL (ref 150–400)
RBC: 3.99 MIL/uL (ref 3.87–5.11)
RDW: 15.4 % (ref 11.5–15.5)
WBC: 13.7 10*3/uL — ABNORMAL HIGH (ref 4.0–10.5)
nRBC: 0.1 % (ref 0.0–0.2)

## 2022-09-03 MED ORDER — NIFEDIPINE ER 30 MG PO TB24: 30.0000 mg | ORAL_TABLET | Freq: Every day | ORAL | 0 refills | Status: DC

## 2022-09-03 NOTE — Lactation Note (Signed)
This note was copied from a baby's chart. Lactation Consultation Note  Patient Name: Maria Murray BTVDF'P Date: 09/03/2022 Reason for consult: Follow-up assessment Age:36 hours  P1, Feeding continues to improve.  Encouraged mother to offer opportunities at the breast and then supplement after with mother's milk and the difference with formula.  Mother pumped 18 ml. Her breastmilk is transitioning.   Mother will call for LC to view next feeding.  Encouraged her to call for assistance with feeding.  Reviewed engorgement care and monitoring voids/stools. Family has outpatient lactation at home.   Feeding Mother's Current Feeding Choice: Breast Milk and Formula   Interventions Interventions: Education  Discharge Discharge Education: Engorgement and breast care;Warning signs for feeding baby;Outpatient recommendation  Consult Status Consult Status: Complete Date: 09/03/22    [name] [facility] 09/03/2022, 9:13 AM

## 2022-09-08 ENCOUNTER — Telehealth (HOSPITAL_COMMUNITY): Payer: Self-pay | Admitting: *Deleted

## 2022-09-08 NOTE — Telephone Encounter (Signed)
Left phone voicemail message.  Odis Hollingshead, RN 09-08-2022 at 10:53am

## 2022-09-11 ENCOUNTER — Inpatient Hospital Stay (HOSPITAL_COMMUNITY)
Admission: AD | Admit: 2022-09-11 | Payer: Managed Care, Other (non HMO) | Source: Home / Self Care | Admitting: Obstetrics & Gynecology

## 2022-09-16 ENCOUNTER — Other Ambulatory Visit: Payer: Self-pay | Admitting: Obstetrics & Gynecology

## 2022-09-16 DIAGNOSIS — R7401 Elevation of levels of liver transaminase levels: Secondary | ICD-10-CM

## 2022-09-17 ENCOUNTER — Ambulatory Visit
Admission: RE | Admit: 2022-09-17 | Discharge: 2022-09-17 | Disposition: A | Discharge status: Home / Self Care | Payer: Managed Care, Other (non HMO) | Source: Ambulatory Visit | Attending: Obstetrics & Gynecology | Admitting: Obstetrics & Gynecology

## 2022-09-17 DIAGNOSIS — R7401 Elevation of levels of liver transaminase levels: Secondary | ICD-10-CM

## 2022-10-02 ENCOUNTER — Other Ambulatory Visit: Payer: Managed Care, Other (non HMO)

## 2024-02-09 LAB — OB RESULTS CONSOLE HEPATITIS B SURFACE ANTIGEN: Hepatitis B Surface Ag: NEGATIVE

## 2024-02-09 LAB — OB RESULTS CONSOLE GC/CHLAMYDIA
Chlamydia: NEGATIVE
Neisseria Gonorrhea: NEGATIVE

## 2024-02-09 LAB — OB RESULTS CONSOLE RUBELLA ANTIBODY, IGM: Rubella: IMMUNE

## 2024-02-09 LAB — HEPATITIS C ANTIBODY: HCV Ab: NEGATIVE

## 2024-02-09 LAB — OB RESULTS CONSOLE HIV ANTIBODY (ROUTINE TESTING): HIV: NONREACTIVE

## 2024-02-10 ENCOUNTER — Other Ambulatory Visit: Payer: Self-pay | Admitting: Obstetrics & Gynecology

## 2024-02-21 NOTE — Anesthesia Preprocedure Evaluation (Addendum)
 Anesthesia Evaluation  Patient identified by MRN, date of birth, ID band Patient awake    Reviewed: Allergy & Precautions, NPO status , Patient's Chart, lab work & pertinent test results  Airway Mallampati: II  TM Distance: >3 FB Neck ROM: Full    Dental  (+) Teeth Intact, Dental Advisory Given   Pulmonary neg pulmonary ROS   Pulmonary exam normal breath sounds clear to auscultation       Cardiovascular negative cardio ROS Normal cardiovascular exam Rhythm:Regular Rate:Normal     Neuro/Psych negative neurological ROS  negative psych ROS   GI/Hepatic negative GI ROS, Neg liver ROS,,,  Endo/Other  negative endocrine ROS    Renal/GU negative Renal ROS  negative genitourinary   Musculoskeletal negative musculoskeletal ROS (+)    Abdominal   Peds  Hematology negative hematology ROS (+) Hb 12.1, plt 210   Anesthesia Other Findings   Reproductive/Obstetrics (+) Pregnancy Cervical incompetence, hx cerclage 2023                              Anesthesia Physical Anesthesia Plan  ASA: 2  Anesthesia Plan: Spinal   Post-op Pain Management: Ofirmev  IV (intra-op)*   Induction:   PONV Risk Score and Plan: 2 and Ondansetron , Dexamethasone  and Treatment may vary due to age or medical condition  Airway Management Planned: Natural Airway and Nasal Cannula  Additional Equipment: None  Intra-op Plan:   Post-operative Plan:   Informed Consent: I have reviewed the patients History and Physical, chart, labs and discussed the procedure including the risks, benefits and alternatives for the proposed anesthesia with the patient or authorized representative who has indicated his/her understanding and acceptance.       Plan Discussed with: CRNA  Anesthesia Plan Comments:         Anesthesia Quick Evaluation

## 2024-02-24 ENCOUNTER — Encounter (HOSPITAL_COMMUNITY): Payer: Self-pay | Admitting: Obstetrics & Gynecology

## 2024-02-25 ENCOUNTER — Encounter (HOSPITAL_COMMUNITY)
Admission: RE | Disposition: A | Discharge status: Home / Self Care | Payer: Self-pay | Source: Home / Self Care | Attending: Obstetrics & Gynecology

## 2024-02-25 ENCOUNTER — Other Ambulatory Visit: Payer: Self-pay

## 2024-02-25 ENCOUNTER — Ambulatory Visit (HOSPITAL_COMMUNITY)
Admission: RE | Admit: 2024-02-25 | Discharge: 2024-02-25 | Disposition: A | Discharge status: Home / Self Care | Attending: Obstetrics & Gynecology | Admitting: Obstetrics & Gynecology

## 2024-02-25 ENCOUNTER — Ambulatory Visit (HOSPITAL_COMMUNITY): Payer: Self-pay | Admitting: Anesthesiology

## 2024-02-25 ENCOUNTER — Ambulatory Visit (HOSPITAL_BASED_OUTPATIENT_CLINIC_OR_DEPARTMENT_OTHER): Payer: Self-pay | Admitting: Anesthesiology

## 2024-02-25 ENCOUNTER — Encounter (HOSPITAL_COMMUNITY): Payer: Self-pay | Admitting: Obstetrics & Gynecology

## 2024-02-25 DIAGNOSIS — Z79890 Hormone replacement therapy (postmenopausal): Secondary | ICD-10-CM | POA: Insufficient documentation

## 2024-02-25 DIAGNOSIS — N883 Incompetence of cervix uteri: Secondary | ICD-10-CM

## 2024-02-25 DIAGNOSIS — Z3A13 13 weeks gestation of pregnancy: Secondary | ICD-10-CM | POA: Diagnosis not present

## 2024-02-25 DIAGNOSIS — O3431 Maternal care for cervical incompetence, first trimester: Secondary | ICD-10-CM | POA: Insufficient documentation

## 2024-02-25 HISTORY — PX: CERVICAL CERCLAGE: SHX1329

## 2024-02-25 LAB — CBC
HCT: 36.3 % (ref 36.0–46.0)
Hemoglobin: 12.1 g/dL (ref 12.0–15.0)
MCH: 28.7 pg (ref 26.0–34.0)
MCHC: 33.3 g/dL (ref 30.0–36.0)
MCV: 86 fL (ref 80.0–100.0)
Platelets: 210 K/uL (ref 150–400)
RBC: 4.22 MIL/uL (ref 3.87–5.11)
RDW: 13.2 % (ref 11.5–15.5)
WBC: 8.8 K/uL (ref 4.0–10.5)
nRBC: 0 % (ref 0.0–0.2)

## 2024-02-25 LAB — RPR: RPR Ser Ql: NONREACTIVE

## 2024-02-25 SURGERY — CERCLAGE, CERVIX, VAGINAL APPROACH
Anesthesia: Spinal

## 2024-02-25 MED ORDER — ONDANSETRON HCL 4 MG/2ML IJ SOLN: 4.0000 mg | Freq: Once | INTRAMUSCULAR | Status: DC | PRN

## 2024-02-25 MED ORDER — FENTANYL CITRATE (PF) 100 MCG/2ML IJ SOLN
INTRAMUSCULAR | Status: AC
  Filled 2024-02-25: qty 2

## 2024-02-25 MED ORDER — POVIDONE-IODINE 10 % EX SWAB
2.0000 | Freq: Once | CUTANEOUS | Status: AC
  Administered 2024-02-25: 2 via TOPICAL

## 2024-02-25 MED ORDER — ONDANSETRON HCL 4 MG/2ML IJ SOLN
INTRAMUSCULAR | Status: DC | PRN
  Administered 2024-02-25: 4 mg via INTRAVENOUS

## 2024-02-25 MED ORDER — CEFAZOLIN SODIUM-DEXTROSE 2-4 GM/100ML-% IV SOLN: 2.0000 g | INTRAVENOUS | Status: DC

## 2024-02-25 MED ORDER — LACTATED RINGERS IV SOLN: INTRAVENOUS | Status: DC

## 2024-02-25 MED ORDER — CHLOROPROCAINE HCL (PF) 3 % IJ SOLN
INTRAMUSCULAR | Status: DC | PRN
  Administered 2024-02-25: 1.6 mL

## 2024-02-25 MED ORDER — PHENYLEPHRINE HCL-NACL 20-0.9 MG/250ML-% IV SOLN
INTRAVENOUS | Status: AC
  Filled 2024-02-25: qty 250

## 2024-02-25 MED ORDER — STERILE WATER FOR IRRIGATION IR SOLN
Status: DC | PRN
  Administered 2024-02-25: 1

## 2024-02-25 MED ORDER — DEXAMETHASONE SODIUM PHOSPHATE 10 MG/ML IJ SOLN
INTRAMUSCULAR | Status: AC
  Filled 2024-02-25: qty 1

## 2024-02-25 MED ORDER — FENTANYL CITRATE (PF) 100 MCG/2ML IJ SOLN
INTRAMUSCULAR | Status: DC | PRN
  Administered 2024-02-25: 15 ug via INTRATHECAL

## 2024-02-25 MED ORDER — CEFAZOLIN SODIUM-DEXTROSE 2-4 GM/100ML-% IV SOLN
INTRAVENOUS | Status: AC
  Filled 2024-02-25: qty 100

## 2024-02-25 MED ORDER — ONDANSETRON HCL 4 MG/2ML IJ SOLN
INTRAMUSCULAR | Status: AC
  Filled 2024-02-25: qty 2

## 2024-02-25 MED ORDER — DEXAMETHASONE SODIUM PHOSPHATE 10 MG/ML IJ SOLN
INTRAMUSCULAR | Status: DC | PRN
  Administered 2024-02-25: 8 mg via INTRAVENOUS

## 2024-02-25 MED ORDER — FENTANYL CITRATE (PF) 100 MCG/2ML IJ SOLN: 25.0000 ug | INTRAMUSCULAR | Status: DC | PRN

## 2024-02-25 MED ORDER — SODIUM CHLORIDE 0.9 % IR SOLN
Status: DC | PRN
  Administered 2024-02-25: 1

## 2024-02-25 MED ORDER — PHENYLEPHRINE HCL-NACL 20-0.9 MG/250ML-% IV SOLN
INTRAVENOUS | Status: DC | PRN
  Administered 2024-02-25: 60 ug/min via INTRAVENOUS

## 2024-02-25 SURGICAL SUPPLY — 16 items
CANISTER SUCT 3000ML PPV (MISCELLANEOUS) ×1 IMPLANT
GLOVE BIO SURGEON STRL SZ7 (GLOVE) ×1 IMPLANT
GLOVE BIOGEL PI IND STRL 7.0 (GLOVE) ×1 IMPLANT
GOWN STRL REUS W/TWL LRG LVL3 (GOWN DISPOSABLE) ×2 IMPLANT
NDL MAYO CATGUT SZ4 TPR NDL (NEEDLE) IMPLANT
NEEDLE MAYO CATGUT SZ4 (NEEDLE) IMPLANT
NS IRRIG 1000ML POUR BTL (IV SOLUTION) ×1 IMPLANT
PACK VAGINAL MINOR WOMEN LF (CUSTOM PROCEDURE TRAY) ×1 IMPLANT
PAD OB MATERNITY 4.3X12.25 (PERSONAL CARE ITEMS) ×1 IMPLANT
PAD PREP 24X48 CUFFED NSTRL (MISCELLANEOUS) ×1 IMPLANT
SUT PROLENE 1 CT 1 30 (SUTURE) ×1 IMPLANT
SYR BULB IRRIGATION 50ML (SYRINGE) ×1 IMPLANT
TOWEL OR 17X24 6PK STRL BLUE (TOWEL DISPOSABLE) ×2 IMPLANT
TRAY FOLEY W/BAG SLVR 14FR (SET/KITS/TRAYS/PACK) ×1 IMPLANT
TUBING NON-CON 1/4 X 20 CONN (TUBING) ×1 IMPLANT
YANKAUER SUCT BULB TIP NO VENT (SUCTIONS) ×1 IMPLANT

## 2024-02-25 NOTE — Transfer of Care (Signed)
 Immediate Anesthesia Transfer of Care Note  Patient: Maria Murray  Procedure(s) Performed: CERCLAGE, CERVIX, VAGINAL APPROACH  Patient Location: PACU  Anesthesia Type:Spinal  Level of Consciousness: awake, alert , and oriented  Airway & Oxygen Therapy: Patient Spontanous Breathing  Post-op Assessment: Report given to RN and Post -op Vital signs reviewed and stable  Post vital signs: Reviewed and stable  Last Vitals:  Vitals Value Taken Time  BP    Temp    Pulse 71 02/25/24 08:53  Resp 18 02/25/24 08:53  SpO2 100 % 02/25/24 08:53  Vitals shown include unfiled device data.  Last Pain:  Vitals:   02/25/24 0622  TempSrc: Oral  PainSc: 0-No pain         Complications: No notable events documented.

## 2024-02-25 NOTE — Op Note (Signed)
 02/25/24  Maria Murray (1986-01-29)   Procedure: McDonald cervical cerclage  Pre op diagnosis cervical incompetence 13 weeks  Post op diagnosis:  same  Surgeon Robbi Render MD  Assistant None IVF LR 800 cc  EBL 5 cc  Urine in foley 50 cc   Informed written consent was obtained after reviewing risks/ complications and future risk of infection/ miscarriage/ PROM and PTD. Understands and agrees.  Patient was brought to the operating room with IV running. She underwent Spinal anesthesia without complications. She was given dorsolithotomy position. Parts were prepped and draped in standard fashion. Bladder was catheterized with foley. Speculum was placed and cervix was evaluated. External os appeared normal and closed and cervix appeared short (2.5 cm length).  Bladder noted at cervicovaginal junction. McDonald cerclage performed using [place] tape, starting at 1 o'clock and going in anti-clock direction taking regular purse string stitch while grasping cervix with ring forceps. Knot tied at 1 o'clock. Bleeding from needle entry- exit sites stopped after stitch was tied down.  Hemostasis excellent. Instruments removed. All counts correct x2.   Patient will be discharged home today. Warning signs of infection and excessive bleeding and miscarriage precautions reviewed.   Robbi Render, MD.

## 2024-02-25 NOTE — Discharge Summary (Signed)
 Physician Discharge Summary  Patient ID: Maria Murray MRN: [id number] DOB/AGE: May 29, 1986 38 y.o.  Admit date: 02/25/2024 Discharge date: 02/25/2024  Admission Diagnoses: 13 weeks Cervical incompetence  Discharge Diagnoses:  Same S/p McDonald's cervical cerclage   Discharged Condition: good  Hospital Course: uncomplicated   Discharge Exam: Blood pressure 102/65, pulse 71, temperature 97.7 F (36.5 C), temperature source Oral, resp. rate 18, height 5' 7 (1.702 m), weight 80.2 kg, SpO2 100%, unknown if currently breastfeeding.  Disposition: Discharge disposition: 01-Home or Self Care       Discharge Instructions     Call MD for:   Complete by: As directed    Heavy vaginal bleeding   Call MD for:  persistant nausea and vomiting   Complete by: As directed    Call MD for:  temperature >100.4   Complete by: As directed    Diet - low sodium heart healthy   Complete by: As directed    Driving Restrictions   Complete by: As directed    1 week   Increase activity slowly   Complete by: As directed    Lifting restrictions   Complete by: As directed    No more than 10 pounds in pregnancy   No wound care   Complete by: As directed    Sexual Activity Restrictions   Complete by: As directed    Until after delivery      Allergies as of 02/25/2024   No Known Allergies      Medication List     TAKE these medications    aspirin EC 81 MG tablet Take 81 mg by mouth daily. Swallow whole.   DHA PO Take 1 capsule by mouth daily.   docusate sodium  100 MG capsule Commonly known as: COLACE Take 100 mg by mouth daily.   prenatal multivitamin Tabs tablet Take 1 tablet by mouth daily.   progesterone 100 MG capsule Commonly known as: PROMETRIUM Take 100 mg by mouth at bedtime.        Follow-up Information     Barbette Knock, MD Follow up in 2 week(s).   Specialty: Obstetrics and Gynecology Contact information: [address] [place] [address] [phone]                  Signed: Knock JONELLE Barbette 02/25/2024, 9:30 AM

## 2024-02-25 NOTE — Anesthesia Postprocedure Evaluation (Signed)
 Anesthesia Post Note  Patient: Maria Murray  Procedure(s) Performed: CERCLAGE, CERVIX, VAGINAL APPROACH     Patient location during evaluation: PACU Anesthesia Type: Spinal Level of consciousness: awake and alert and oriented Pain management: pain level controlled Vital Signs Assessment: post-procedure vital signs reviewed and stable Respiratory status: spontaneous breathing, nonlabored ventilation and respiratory function stable Cardiovascular status: blood pressure returned to baseline and stable Postop Assessment: no headache, no backache, able to ambulate, no apparent nausea or vomiting and spinal receding Anesthetic complications: no   No notable events documented.  Last Vitals:  Vitals:   02/25/24 1000 02/25/24 1015  BP: (!) 89/52 (!) 98/57  Pulse: 63 64  Resp: 12 11  Temp:  36.6 C  SpO2: 100% 100%    Last Pain:  Vitals:   02/25/24 1015  TempSrc: Oral  PainSc: 0-No pain   Pain Goal:    LLE Motor Response: Purposeful movement (02/25/24 1015) LLE Sensation: Tingling (02/25/24 1015) RLE Motor Response: Purposeful movement (02/25/24 1015) RLE Sensation: Tingling (02/25/24 1015)     Epidural/Spinal Function Cutaneous sensation: Tingles (02/25/24 1000), Patient able to flex knees: Yes (02/25/24 1000), Patient able to lift hips off bed: Yes (02/25/24 1000), Back pain beyond tenderness at insertion site: No (02/25/24 1000), Progressively worsening motor and/or sensory loss: No (02/25/24 1000), Bowel and/or bladder incontinence post epidural: No (02/25/24 1000)  [name]

## 2024-02-25 NOTE — Anesthesia Procedure Notes (Signed)
 Spinal  Patient location during procedure: OR Start time: 02/25/2024 7:55 AM End time: 02/25/2024 7:58 AM Reason for block: surgical anesthesia Staffing Performed: anesthesiologist  Anesthesiologist: Merla Almarie HERO, DO Performed by: Merla Almarie HERO, DO Authorized by: Merla Almarie HERO, DO   Preanesthetic Checklist Completed: patient identified, IV checked, risks and benefits discussed, surgical consent, monitors and equipment checked, pre-op evaluation and timeout performed Spinal Block Patient position: sitting Prep: DuraPrep and site prepped and draped Patient monitoring: cardiac monitor, continuous pulse ox and blood pressure Approach: midline Location: L3-4 Injection technique: single-shot Needle Needle type: Pencan  Needle gauge: 24 G Needle length: 9 cm Assessment Sensory level: T6 Events: CSF return Additional Notes Functioning IV was confirmed and monitors were applied. Sterile prep and drape, including hand hygiene and sterile gloves were used. The patient was positioned and the spine was prepped. The skin was anesthetized with lidocaine .  Free flow of clear CSF was obtained prior to injecting local anesthetic into the CSF.  The spinal needle aspirated freely following injection.  The needle was carefully withdrawn.  The patient tolerated the procedure well.

## 2024-02-25 NOTE — H&P (Signed)
 Maria Murray is a 38 y.o. female presenting for cervical cerclage at 13 weeks  G3P1101,  13 wks by 1st trim sono, has incompetent cervix, here for prophylactic cervical cerclage. She had successful term pregnnancy after cerclage in last pregnnacy but had postpartum preeclampsia.  She is taking Prometrium vaginally as she did last pregnancy and will stop at 16 weeks  AMA- NIPT low risk, sono- early anatomy nl    OB History     Gravida  3   Para  2   Term  1   Preterm  1   AB  0   Living  1      SAB  0   IAB  0   Ectopic  0   Multiple  0   Live Births  1          Past Medical History:  Diagnosis Date   Anemia    Incompetent cervix in pregnancy    removed the 22nd of December   Past Surgical History:  Procedure Laterality Date   CERVICAL CERCLAGE N/A 03/10/2022   Procedure: McDonald CERVICAL CERCLAGE;  Surgeon: Barbette Knock, MD;  Location: [facility] LD ORS;  Service: Gynecology;  Laterality: N/A;  EDD: 09/11/22 Requests 45 min.   NO PAST SURGERIES     Family History: family history includes Diabetes in her father, maternal grandfather, maternal grandmother, paternal grandfather, and paternal grandmother; Hyperlipidemia in her maternal grandfather, mother, and paternal grandfather; Hypertension in her father. Social History:  reports that she has never smoked. She has never used smokeless tobacco. She reports that she does not currently use alcohol. She reports that she does not use drugs.   Blood pressure 104/67, pulse 75, temperature 97.7 F (36.5 C), temperature source Oral, resp. rate 16, height 5' 7 (1.702 m), weight 80.2 kg, SpO2 100%, unknown if currently breastfeeding.  Physical exam:  A&O x 3, no acute distress. Pleasant HEENT neg, no thyromegaly Lungs CTA bilat CV RRR, S1S2 normal Abdo soft, non tender, non acute Extr no edema/ tenderness Pelvic deferred  FHT  FCA noted by bedside sono  Assessment/Plan: 38 yo female 13 wks here for McDonald's  cervical cerclage for incompetent cervix Risks/complications of surgery reviewed incl infection, bleeding, damage to internal organs including bladder, bowels, ureters, blood vessels, other risks from anesthesia, VTE and delayed complications of any surgery, complications in future surgery reviewed.    Othelia Riederer R Kylani Wires 02/25/2024, 7:34 AM

## 2024-02-26 ENCOUNTER — Encounter (HOSPITAL_COMMUNITY): Payer: Self-pay | Admitting: Obstetrics & Gynecology

## 2024-07-31 LAB — OB RESULTS CONSOLE GBS: GBS: NEGATIVE

## 2024-08-24 ENCOUNTER — Inpatient Hospital Stay (HOSPITAL_COMMUNITY)
Admission: AD | Admit: 2024-08-24 | Discharge: 2024-08-26 | DRG: 807 | Disposition: A | Discharge status: Home / Self Care | Attending: Obstetrics | Admitting: Obstetrics

## 2024-08-24 ENCOUNTER — Encounter (HOSPITAL_COMMUNITY): Payer: Self-pay | Admitting: Obstetrics

## 2024-08-24 ENCOUNTER — Other Ambulatory Visit: Payer: Self-pay

## 2024-08-24 DIAGNOSIS — O9212 Cracked nipple associated with the puerperium: Secondary | ICD-10-CM | POA: Diagnosis not present

## 2024-08-24 DIAGNOSIS — R7401 Elevation of levels of liver transaminase levels: Secondary | ICD-10-CM | POA: Diagnosis not present

## 2024-08-24 DIAGNOSIS — Z833 Family history of diabetes mellitus: Secondary | ICD-10-CM

## 2024-08-24 DIAGNOSIS — Z3A38 38 weeks gestation of pregnancy: Secondary | ICD-10-CM | POA: Diagnosis not present

## 2024-08-24 DIAGNOSIS — Z8249 Family history of ischemic heart disease and other diseases of the circulatory system: Secondary | ICD-10-CM

## 2024-08-24 DIAGNOSIS — O4292 Full-term premature rupture of membranes, unspecified as to length of time between rupture and onset of labor: Secondary | ICD-10-CM | POA: Diagnosis present

## 2024-08-24 LAB — COMPREHENSIVE METABOLIC PANEL WITH GFR
ALT: 27 U/L (ref 0–44)
AST: 36 U/L (ref 15–41)
Albumin: 3.7 g/dL (ref 3.5–5.0)
Alkaline Phosphatase: 193 U/L — ABNORMAL HIGH (ref 38–126)
Anion gap: 12 (ref 5–15)
BUN: 13 mg/dL (ref 6–20)
CO2: 20 mmol/L — ABNORMAL LOW (ref 22–32)
Calcium: 9.4 mg/dL (ref 8.9–10.3)
Chloride: 103 mmol/L (ref 98–111)
Creatinine, Ser: 0.7 mg/dL (ref 0.44–1.00)
GFR, Estimated: >60 mL/min
Glucose, Bld: 98 mg/dL (ref 70–99)
Potassium: 4 mmol/L (ref 3.5–5.1)
Sodium: 135 mmol/L (ref 135–145)
Total Bilirubin: 0.3 mg/dL (ref 0.0–1.2)
Total Protein: 6.7 g/dL (ref 6.5–8.1)

## 2024-08-24 LAB — TYPE AND SCREEN
ABO/RH(D): O POS
Antibody Screen: NEGATIVE

## 2024-08-24 LAB — CBC
HCT: 40.4 % (ref 36.0–46.0)
Hemoglobin: 13.6 g/dL (ref 12.0–15.0)
MCH: 29.4 pg (ref 26.0–34.0)
MCHC: 33.7 g/dL (ref 30.0–36.0)
MCV: 87.3 fL (ref 80.0–100.0)
Platelets: 202 K/uL (ref 150–400)
RBC: 4.63 MIL/uL (ref 3.87–5.11)
RDW: 14.9 % (ref 11.5–15.5)
WBC: 9.1 K/uL (ref 4.0–10.5)
nRBC: 0 % (ref 0.0–0.2)

## 2024-08-24 LAB — POCT FERN TEST: POCT Fern Test: POSITIVE

## 2024-08-24 LAB — HIV ANTIBODY (ROUTINE TESTING W REFLEX): HIV Screen 4th Generation wRfx: NONREACTIVE

## 2024-08-24 LAB — SYPHILIS: RPR W/REFLEX TO RPR TITER AND TREPONEMAL ANTIBODIES, TRADITIONAL SCREENING AND DIAGNOSIS ALGORITHM: RPR Ser Ql: NONREACTIVE

## 2024-08-24 MED ORDER — IBUPROFEN 600 MG PO TABS
600.0000 mg | ORAL_TABLET | Freq: Four times a day (QID) | ORAL | Status: DC
  Administered 2024-08-24 – 2024-08-26 (×9): 600 mg via ORAL
  Filled 2024-08-24 (×9): qty 1

## 2024-08-24 MED ORDER — TETANUS-DIPHTH-ACELL PERTUSSIS 5-2-15.5 LF-MCG/0.5 IM SUSP: 0.5000 mL | Freq: Once | INTRAMUSCULAR | Status: DC

## 2024-08-24 MED ORDER — OXYCODONE-ACETAMINOPHEN 5-325 MG PO TABS: 1.0000 | ORAL_TABLET | ORAL | Status: DC | PRN

## 2024-08-24 MED ORDER — LACTATED RINGERS IV SOLN: INTRAVENOUS | Status: DC

## 2024-08-24 MED ORDER — OXYTOCIN-SODIUM CHLORIDE 30-0.9 UT/500ML-% IV SOLN
2.5000 [IU]/h | INTRAVENOUS | Status: DC
  Administered 2024-08-24: 2.5 [IU]/h via INTRAVENOUS
  Filled 2024-08-24: qty 500

## 2024-08-24 MED ORDER — COCONUT OIL OIL
1.0000 | TOPICAL_OIL | Status: DC | PRN
  Administered 2024-08-25: 1 via TOPICAL

## 2024-08-24 MED ORDER — SIMETHICONE 80 MG PO CHEW: 80.0000 mg | CHEWABLE_TABLET | ORAL | Status: DC | PRN

## 2024-08-24 MED ORDER — DOCUSATE SODIUM 100 MG PO CAPS: 100.0000 mg | ORAL_CAPSULE | Freq: Every day | ORAL | Status: DC

## 2024-08-24 MED ORDER — OXYTOCIN BOLUS FROM INFUSION
333.0000 mL | Freq: Once | INTRAVENOUS | Status: AC
  Administered 2024-08-24: 333 mL via INTRAVENOUS

## 2024-08-24 MED ORDER — WITCH HAZEL-GLYCERIN EX PADS: 1.0000 | MEDICATED_PAD | CUTANEOUS | Status: DC | PRN

## 2024-08-24 MED ORDER — DOCUSATE SODIUM 100 MG PO CAPS
100.0000 mg | ORAL_CAPSULE | Freq: Two times a day (BID) | ORAL | Status: DC
  Administered 2024-08-24 – 2024-08-26 (×4): 100 mg via ORAL
  Filled 2024-08-24 (×4): qty 1

## 2024-08-24 MED ORDER — OXYCODONE HCL 5 MG PO TABS: 10.0000 mg | ORAL_TABLET | ORAL | Status: DC | PRN

## 2024-08-24 MED ORDER — ZOLPIDEM TARTRATE 5 MG PO TABS: 5.0000 mg | ORAL_TABLET | Freq: Every evening | ORAL | Status: DC | PRN

## 2024-08-24 MED ORDER — BENZOCAINE-MENTHOL 20-0.5 % EX AERO: 1.0000 | INHALATION_SPRAY | CUTANEOUS | Status: DC | PRN

## 2024-08-24 MED ORDER — PRENATAL MULTIVITAMIN CH
1.0000 | ORAL_TABLET | Freq: Every day | ORAL | Status: DC
  Filled 2024-08-24 (×3): qty 1

## 2024-08-24 MED ORDER — LIDOCAINE HCL (PF) 1 % IJ SOLN
30.0000 mL | INTRAMUSCULAR | Status: AC | PRN
  Administered 2024-08-24: 30 mL via SUBCUTANEOUS
  Filled 2024-08-24: qty 30

## 2024-08-24 MED ORDER — ONDANSETRON HCL 4 MG PO TABS: 4.0000 mg | ORAL_TABLET | ORAL | Status: DC | PRN

## 2024-08-24 MED ORDER — ASPIRIN 81 MG PO TBEC
81.0000 mg | DELAYED_RELEASE_TABLET | Freq: Every day | ORAL | Status: DC
  Administered 2024-08-24 – 2024-08-26 (×3): 81 mg via ORAL
  Filled 2024-08-24 (×3): qty 1

## 2024-08-24 MED ORDER — OXYCODONE-ACETAMINOPHEN 5-325 MG PO TABS: 2.0000 | ORAL_TABLET | ORAL | Status: DC | PRN

## 2024-08-24 MED ORDER — ACETAMINOPHEN 325 MG PO TABS: 650.0000 mg | ORAL_TABLET | ORAL | Status: DC | PRN

## 2024-08-24 MED ORDER — LACTATED RINGERS IV SOLN: 500.0000 mL | INTRAVENOUS | Status: DC | PRN

## 2024-08-24 MED ORDER — DIBUCAINE (PERIANAL) 1 % EX OINT: 1.0000 | TOPICAL_OINTMENT | CUTANEOUS | Status: DC | PRN

## 2024-08-24 MED ORDER — ONDANSETRON HCL 4 MG/2ML IJ SOLN: 4.0000 mg | Freq: Four times a day (QID) | INTRAMUSCULAR | Status: DC | PRN

## 2024-08-24 MED ORDER — DIPHENHYDRAMINE HCL 25 MG PO CAPS: 25.0000 mg | ORAL_CAPSULE | Freq: Four times a day (QID) | ORAL | Status: DC | PRN

## 2024-08-24 MED ORDER — OXYCODONE HCL 5 MG PO TABS: 5.0000 mg | ORAL_TABLET | ORAL | Status: DC | PRN

## 2024-08-24 MED ORDER — ONDANSETRON HCL 4 MG/2ML IJ SOLN: 4.0000 mg | INTRAMUSCULAR | Status: DC | PRN

## 2024-08-24 MED ORDER — SOD CITRATE-CITRIC ACID 500-334 MG/5ML PO SOLN: 30.0000 mL | ORAL | Status: DC | PRN

## 2024-08-24 MED ORDER — FLEET ENEMA RE ENEM: 1.0000 | ENEMA | RECTAL | Status: DC | PRN

## 2024-08-24 NOTE — Lactation Note (Signed)
 This note was copied from a baby's chart. Lactation Consultation Note  Patient Name: Maria Murray Unijb'd Date: 08/24/2024 Age:38 hours Reason for consult: Initial assessment;Early term 37-38.6wks  P2, Mother attempting to latch when [facility] entered the room. Mother hand expressed drops and latched baby in cradle hold. Demonstrated how to latch in cross cradle for increased head support. Baby latched on with good depth, flanged lips and intermittent swallows noted. Feed on demand with cues.  Goal 8-12+ times per day after first 24 hrs.  Place baby STS if not cueing.  Reviewed basics and suggest family call if further assistance is needed.  Maternal Data Has patient been taught Hand Expression?: Yes Does the patient have breastfeeding experience prior to this delivery?: Yes How long did the patient breastfeed?: 14 mos.  Feeding Mother's Current Feeding Choice: Breast Milk  LATCH Score Latch: Grasps breast easily, tongue down, lips flanged, rhythmical sucking.  Audible Swallowing: A few with stimulation  Type of Nipple: Everted at rest and after stimulation  Comfort (Breast/Nipple): Soft / non-tender  Hold (Positioning): Assistance needed to correctly position infant at breast and maintain latch.  LATCH Score: 8  Interventions Interventions: Breast feeding basics reviewed;Assisted with latch;Skin to skin;Hand express;Education;Support pillows;Position options;CDC milk storage guidelines;LC Services brochure  Discharge Pump: Personal;DEBP (Spectra )  Consult Status Consult Status: Follow-up Date: 08/25/24 Follow-up type: In-patient   Shannon Levorn Lemme  RN, IBCLC 08/24/2024, 10:32 AM

## 2024-08-24 NOTE — H&P (Signed)
 Maria Murray is a 39 y.o. G3P1101 at [redacted]w[redacted]d presenting for rupture of membranes and active labor. Pt notes strong contractions. Good fetal movement, No vaginal bleeding, started leaking fluid at 2 AM.  [facility] wks - Dated by early ultrasound consistent with LMP - History of cervical incompetence, prophylactic cerclage placed at 12 weeks, removed around 36 weeks - History of postpartum preeclampsia.  On baby aspirin through pregnancy   Prenatal Transfer Tool  Maternal Diabetes: No Genetic Screening: Normal Maternal Ultrasounds/Referrals: Normal Fetal Ultrasounds or other Referrals:  None Maternal Substance Abuse:  No Significant Maternal Medications:  None Significant Maternal Lab Results: Group B Strep negative Number of Prenatal Visits:greater than 3 verified prenatal visits Maternal Vaccinations:RSV: Given during pregnancy >/=14 days ago, TDap, and Flu Other Comments:  None       OB History     Gravida  3   Para  2   Term  1   Preterm  1   AB  0   Living  1      SAB  0   IAB  0   Ectopic  0   Multiple  0   Live Births  1          Past Medical History:  Diagnosis Date   Anemia    Incompetent cervix in pregnancy    removed the 22nd of December   Past Surgical History:  Procedure Laterality Date   CERVICAL CERCLAGE N/A 03/10/2022   Procedure: McDonald CERVICAL CERCLAGE;  Surgeon: Barbette Knock, MD;  Location: [facility] LD ORS;  Service: Gynecology;  Laterality: N/A;  EDD: 09/11/22 Requests 45 min.   CERVICAL CERCLAGE N/A 02/25/2024   Procedure: CERCLAGE, CERVIX, VAGINAL APPROACH;  Surgeon: Barbette Knock, MD;  Location: [facility] LD ORS;  Service: Obstetrics;  Laterality: N/A;   NO PAST SURGERIES     Family History: family history includes Diabetes in her father, maternal grandfather, maternal grandmother, paternal grandfather, and paternal grandmother; Hyperlipidemia in her maternal grandfather, mother, and paternal grandfather;  Hypertension in her father. Social History:  reports that she has never smoked. She has never used smokeless tobacco. She reports that she does not currently use alcohol. She reports that she does not use drugs.  Review of Systems - Negative except active labor and leaking fluid   Dilation: 9 Effacement (%): 100 Station: -1 Exam by:: [employer] RN Blood pressure (!) 156/59, pulse 76, temperature 97.8 F (36.6 C), temperature source Oral, resp. rate 18, weight 92.4 kg, SpO2 100%, unknown if currently breastfeeding.  Physical Exam:  Gen: well appearing, no distress Skin: Warm and dry Abd: gravid, NT, no RUQ pain LE: No edema, equal bilaterally, non-tender Toco: Regular FH: Reactive, accelerations present, no deceleratons, 10 beat variability  Prenatal labs: ABO, Rh: --/--/O POS (01/01 0250) Antibody: NEG (01/01 0250) Rubella:  Immune RPR: NON REACTIVE (07/04 9367)  HBsAg: Negative (06/18 0000)  HIV: Non-reactive (06/18 0000)  GBS: Negative/-- (12/08 0000)  1 hr Glucola 110  Genetic screening normal panorama Anatomy US  normal   Assessment/Plan: 39 y.o. G3P1101 at [redacted]w[redacted]d Active labor.  Reactive NST.  Prepare for delivery.  Nitrous oxide for pain control History of postpartum preeclampsia in a prior pregnancy.  Will continue baby aspirin.  Normal labs on admission will repeat tomorrow.    Burnard DELENA Bowers 08/24/2024, 5:48 AM

## 2024-08-24 NOTE — L&D Delivery Note (Signed)
 Delivery Note At 5:05 AM a viable female was delivered via Vaginal, Spontaneous (Presentation: Right Occiput Anterior).  APGAR: 8, 10; weight  .   Placenta status: Spontaneous, Intact.  Cord: 3 vessels with the following complications: None.  Cord pH: n/a  Anesthesia: None Episiotomy: None Lacerations: 2nd degree Suture Repair: 3.0 vicryl rapide Est. Blood Loss (mL):  350  Mom to postpartum.  Baby to Couplet care / Skin to Skin.  Burnard DELENA Bowers 08/24/2024, 5:47 AM

## 2024-08-24 NOTE — MAU Note (Signed)
 MAU Labor Triage Note:  .Maria Murray is a 39 y.o. at [redacted]w[redacted]d here in MAU reporting:  Contractions every: 5 minutes Onset of ctx: today Pain Score: 7  Pain Location: Abdomen  ROM: gush of fluid at 0159; fluid is clear Vaginal Bleeding: denies Last SVE: Tuesday 2-3 cm with membrane sweep Labor Pain Management Plan: Nitrous Oxide  GBS: Negative  Fetal Movement: Reports positive FM FHT:  bypass triage, patient taken directly to room  Vitals:   08/24/24 0235  BP: (!) 141/80  Pulse: 68  Resp: 20  Temp: 97.6 F (36.4 C)  SpO2: 100%      Lab orders placed from triage: MAU Labor Eval OB Office: [employer]

## 2024-08-25 LAB — CBC
HCT: 36.6 % (ref 36.0–46.0)
Hemoglobin: 12.2 g/dL (ref 12.0–15.0)
MCH: 29.5 pg (ref 26.0–34.0)
MCHC: 33.3 g/dL (ref 30.0–36.0)
MCV: 88.4 fL (ref 80.0–100.0)
Platelets: 162 K/uL (ref 150–400)
RBC: 4.14 MIL/uL (ref 3.87–5.11)
RDW: 15.3 % (ref 11.5–15.5)
WBC: 12.7 K/uL — ABNORMAL HIGH (ref 4.0–10.5)
nRBC: 0 % (ref 0.0–0.2)

## 2024-08-25 LAB — COMPREHENSIVE METABOLIC PANEL WITH GFR
ALT: 24 U/L (ref 0–44)
AST: 35 U/L (ref 15–41)
Albumin: 3.2 g/dL — ABNORMAL LOW (ref 3.5–5.0)
Alkaline Phosphatase: 145 U/L — ABNORMAL HIGH (ref 38–126)
Anion gap: 8 (ref 5–15)
BUN: 11 mg/dL (ref 6–20)
CO2: 25 mmol/L (ref 22–32)
Calcium: 8.8 mg/dL — ABNORMAL LOW (ref 8.9–10.3)
Chloride: 105 mmol/L (ref 98–111)
Creatinine, Ser: 0.77 mg/dL (ref 0.44–1.00)
GFR, Estimated: >60 mL/min
Glucose, Bld: 75 mg/dL (ref 70–99)
Potassium: 3.9 mmol/L (ref 3.5–5.1)
Sodium: 138 mmol/L (ref 135–145)
Total Bilirubin: 0.2 mg/dL (ref 0.0–1.2)
Total Protein: 5.6 g/dL — ABNORMAL LOW (ref 6.5–8.1)

## 2024-08-25 NOTE — Lactation Note (Addendum)
 This note was copied from a baby's chart. Lactation Consultation Note  Patient Name: Maria Murray Unijb'd Date: 08/25/2024 Age:38 hours  Reason for consult: Follow-up assessment;Infant weight loss;Early term 37-38.6wks;Breastfeeding assistance  P2, [redacted]w[redacted]d, 7% weight loss  Follow up LC visit with P2 mother and baby Maria Hannah. Mother had baby latched to her breast upon my arrival. Mother demonstrates independence with latching. She breast fed her first child for 14 months (stopped with this pregnancy).   Mother requested this LC observe her latch for depth. Baby was rhythmically tugging at the left breast non-painfully with audible swallows. Baby had a narrow gape and when baby came off the breast, mother's nipple was slightly pinched. Assisted mother with a symmetrical latch, allowing chin to rest deeper in the breast and wider gape. Baby Maria Chiquita was very eager and it took a few times before mother was able to achieve this latch on the right breast. Colostrum is plentiful and mother had 4 mL expressed with her hand pump.  Due to weight decrease, baby's provider has encouraged mother to supplement after breastfeeding. Mother was taught how to finger feed with curved tip syringe. Baby was fed her 4 mL of colostrum. Baby tolerated well.   Mother prefers to supplement with her own milk. She has requested to use the DEBP with colostrum collector. Family came to room to see baby. Will return to set up the DEBP and educate regarding use.   Maternal Data Has patient been taught Hand Expression?: Yes  Feeding Mother's Current Feeding Choice: Breast Milk  LATCH Score Latch: Grasps breast easily, tongue down, lips flanged, rhythmical sucking.  Audible Swallowing: Spontaneous and intermittent  Type of Nipple: Everted at rest and after stimulation  Comfort (Breast/Nipple): Soft / non-tender  Hold (Positioning): Assistance needed to correctly position infant at breast and maintain  latch.  LATCH Score: 9   Lactation Tools Discussed/Used Tools: Pump;Flanges Flange Size: 21 Breast pump type: Double-Electric Breast Pump Pump Education: Setup, frequency, and cleaning;Milk Storage Reason for Pumping: supplement with colostrum Pumping frequency: every 3 hrs for 15 min Pumped volume: 4 mL (with manual pump)  Interventions Interventions: Breast feeding basics reviewed;Assisted with latch;Skin to skin;Hand express;Breast compression;Support pillows;Position options;DEBP;Education  Discharge Pump: DEBP;Personal (Spectra )  Consult Status Consult Status: Follow-up Date: 08/26/24 Follow-up type: In-patient    Joshua Line M 08/25/2024, 2:18 PM

## 2024-08-25 NOTE — Progress Notes (Signed)
" ° °  PPD #1 S/P NSVD  Live born female  Birth Weight: 7 lb 8.3 oz (3410 g) APGAR: 8, 10  Newborn Delivery   Birth date/time: 08/24/2024 05:05:16 Delivery type: Vaginal, Spontaneous     Delivering provider: KANDYCE SOR  Lacerations: 2nd degree  Feeding: breast  Pain control at delivery: None  S:  Reports feeling well today, no HA, vision change, RUQ pain.              Tolerating PO/No nausea or vomiting             Bleeding is light             Pain controlled with acetaminophen              Up ad lib/ambulatory/voiding without difficulties   O:  A & O x 3, in no apparent distress              VS:  Vitals:   08/24/24 1300 08/24/24 1700 08/24/24 2100 08/25/24 0531  BP: 113/77 127/79 127/71 120/82  Pulse: 95 100 96   Resp: 18 18 16 18   Temp: 98.2 F (36.8 C) 98 F (36.7 C) 98 F (36.7 C) 97.6 F (36.4 C)  TempSrc: Oral Oral Axillary Oral  SpO2: 98% 97% 100% 100%  Weight:        LABS:  Recent Labs    08/24/24 0250 08/25/24 0517  WBC 9.1 12.7*  HGB 13.6 12.2  HCT 40.4 36.6  PLT 202 162    Blood type: --/--/O POS (01/01 0250)  Rubella: Immune (06/18 0000)   I&O: I/O last 3 completed shifts: In: -  Out: 351 [Blood:351]          No intake/output data recorded.  Gen: AAO x 3, NAD Abdomen: soft, non-tender, non-distended Fundus: firm, non-tender, U-1. Perineum: 2nd lac appropriately tender.  Lochia: Minimal.  Extremities: Mild edema, no calf pain or tenderness   A/P:  PPD # 1 38 y.o., [id number]   Principal Problem:   Normal labor    Doing well - stable status s/p NSVD.   H/o pptm PEC at >24hrs pptm with G1, BP's and vitals WNL at this time. To continue LD-ASA and monitoring today, pt hoping to avoid readmission.   Routine post partum orders, lactation as desired.   Anticipate discharge tomorrow and f/u in office early next week. To continue to monitor BP while in-patient and at home BID upon d/c.   Yuuki Skeens G Daishon Chui, MSN, CNM 08/25/2024, 8:42 AM      "

## 2024-08-26 LAB — COMPREHENSIVE METABOLIC PANEL WITH GFR
ALT: 60 U/L — ABNORMAL HIGH (ref 0–44)
AST: 47 U/L — ABNORMAL HIGH (ref 15–41)
Albumin: 3.1 g/dL — ABNORMAL LOW (ref 3.5–5.0)
Alkaline Phosphatase: 125 U/L (ref 38–126)
Anion gap: 8 (ref 5–15)
BUN: 12 mg/dL (ref 6–20)
CO2: 25 mmol/L (ref 22–32)
Calcium: 8.6 mg/dL — ABNORMAL LOW (ref 8.9–10.3)
Chloride: 104 mmol/L (ref 98–111)
Creatinine, Ser: 0.75 mg/dL (ref 0.44–1.00)
GFR, Estimated: >60 mL/min
Glucose, Bld: 74 mg/dL (ref 70–99)
Potassium: 4.2 mmol/L (ref 3.5–5.1)
Sodium: 137 mmol/L (ref 135–145)
Total Bilirubin: <0.2 mg/dL (ref 0.0–1.2)
Total Protein: 5.5 g/dL — ABNORMAL LOW (ref 6.5–8.1)

## 2024-08-26 MED ORDER — IBUPROFEN 600 MG PO TABS: 600.0000 mg | ORAL_TABLET | Freq: Four times a day (QID) | ORAL | 0 refills | Status: AC

## 2024-08-26 MED ORDER — FUROSEMIDE 20 MG PO TABS
20.0000 mg | ORAL_TABLET | Freq: Every day | ORAL | Status: DC
  Administered 2024-08-26: 20 mg via ORAL
  Filled 2024-08-26: qty 1

## 2024-08-26 MED ORDER — ACETAMINOPHEN 325 MG PO TABS: 650.0000 mg | ORAL_TABLET | ORAL | Status: AC | PRN

## 2024-08-26 MED ORDER — FUROSEMIDE 20 MG PO TABS: 20.0000 mg | ORAL_TABLET | Freq: Every day | ORAL | 0 refills | Status: AC

## 2024-08-26 NOTE — Discharge Summary (Signed)
 "    Postpartum Discharge Summary     Patient Name: Maria Murray DOB: [birth year] MRN: [id number]  Date of admission: 08/24/2024 Delivery date:08/24/2024 Delivering provider: KANDYCE SOR Date of discharge: 08/26/2024  Admitting diagnosis: Normal labor [O80, Z37.9] Incompetent cervix with cerclage in pregnancy, removed at 37 wks Spontaneous labor [redacted]w[redacted]d      Additional problems: Postpartum elevated AST 43, ALT 60 with normal BPs and no neural symptoms     Discharge diagnosis: Term Pregnancy Delivered                                              Post partum procedures:none Augmentation: N/A Complications: None  Hospital course: Onset of Labor With Vaginal Delivery      39 y.o. yo [id number] at 101w6d was admitted in Active Labor on 08/24/2024. Labor course was complicated by none. Pain management with Nitrous oxide.   Membrane Rupture Time/Date: 1:59 AM,08/24/2024  Delivery Method:Vaginal, Spontaneous Operative Delivery:N/A Episiotomy: None Lacerations:  2nd degree Patient had a postpartum course complicated by normal BPs but elevated AST 47 and ALT 60.  No neural symptoms. She is ambulating, tolerating a regular diet, passing flatus, and urinating well. Patient is discharged home in stable condition on 08/26/2024. PEC warning ss and close f/up on 1/5 with labs and BP check in office   Newborn Data: Birth date:08/24/2024 Birth time:5:05 AM Gender:Female Living status:Living Apgars:8 ,10  Weight:3410 g  Magnesium Sulfate received: No BMZ received: No Rhophylac:N/A MMR:N/A T-DaP:Given prenatally Flu: Yes RSV Vaccine received: Yes Transfusion:No Immunizations administered: There is no immunization history for the selected administration types on file for this patient.  Physical exam  Vitals:   08/25/24 1618 08/25/24 2113 08/26/24 0527 08/26/24 0853  BP: 120/83 115/71 116/88 (P) 118/80  Pulse:  88 88 (P) 81  Resp: 20 18 18    Temp: 98.2 F (36.8 C)  98.2 F (36.8 C)   TempSrc:  Oral  Oral   SpO2:  98% 98%   Weight:       General: alert, cooperative, and no distress Lochia: appropriate Uterine Fundus: firm Incision: Healing well with no significant drainage, No significant erythema DVT Evaluation: No evidence of DVT seen on physical exam. Labs: Lab Results  Component Value Date   WBC 12.7 (H) 08/25/2024   HGB 12.2 08/25/2024   HCT 36.6 08/25/2024   MCV 88.4 08/25/2024   PLT 162 08/25/2024      Latest Ref Rng & Units 08/26/2024    7:07 AM  CMP  Glucose 70 - 99 mg/dL 74   BUN 6 - 20 mg/dL 12   Creatinine 9.55 - 1.00 mg/dL 9.24   Sodium 864 - 854 mmol/L 137   Potassium 3.5 - 5.1 mmol/L 4.2   Chloride 98 - 111 mmol/L 104   CO2 22 - 32 mmol/L 25   Calcium  8.9 - 10.3 mg/dL 8.6   Total Protein 6.5 - 8.1 g/dL 5.5   Total Bilirubin 0.0 - 1.2 mg/dL <9.7   Alkaline Phos 38 - 126 U/L 125   AST 15 - 41 U/L 47   ALT 0 - 44 U/L 60    Edinburgh Score:    08/24/2024    8:00 AM  Edinburgh Postnatal Depression Scale Screening Tool  I have been able to laugh and see the funny side of things. --      After visit  meds:  Allergies as of 08/26/2024   No Known Allergies      Medication List     STOP taking these medications    DHA PO   progesterone 100 MG capsule Commonly known as: PROMETRIUM       TAKE these medications    acetaminophen  325 MG tablet Commonly known as: Tylenol  Take 2 tablets (650 mg total) by mouth every 4 (four) hours as needed (for pain scale < 4).   aspirin  EC 81 MG tablet Take 81 mg by mouth daily. Swallow whole.   docusate sodium  100 MG capsule Commonly known as: COLACE Take 100 mg by mouth daily.   furosemide  20 MG tablet Commonly known as: LASIX  Take 1 tablet (20 mg total) by mouth daily.   ibuprofen  600 MG tablet Commonly known as: ADVIL  Take 1 tablet (600 mg total) by mouth every 6 (six) hours.   prenatal multivitamin Tabs tablet Take 1 tablet by mouth daily.         Discharge home in stable  condition Infant Feeding: Breast Infant Disposition:home with mother Discharge instruction: per After Visit Summary and Postpartum booklet. Activity: Advance as tolerated. Pelvic rest for 6 weeks.  Diet: routine diet Anticipated Birth Control: Unsure Postpartum Appointment:6 weeks Additional Postpartum F/U: 08/28/24 for BP check and labs  Future Appointments:No future appointments. Follow up Visit:  Follow-up Information     Barbette Knock, MD Follow up in 6 week(s).   Specialty: Obstetrics and Gynecology Contact information: [address] [place] [address] [phone]         Barbette Knock, MD Follow up on 08/28/2024.   Specialty: Obstetrics and Gynecology Why: see Dr Doreen nurse for BP check and office labs CBC. CMP Contact information: [address] [place] [address] [phone]                     08/26/2024 Knock JONELLE Barbette, MD   "

## 2024-08-26 NOTE — Progress Notes (Signed)
 RN called to report HA for 518, at 6 am with no information on patient or vitals. Advised to given me pt's name and vitals. BP nl. Advised to given Ibuprofen  or tylenol 

## 2024-08-26 NOTE — Progress Notes (Addendum)
 Pt seen HA at 6 am resolved. H/o PP PEC w/ mild range BPs and abn LFTs, no magnesium but got pp Procardia  30XL Now BPs nl no neural ss and only slightly elevated AST ALT (not doubled)  Patient Vitals for the past 24 hrs:  BP Temp Temp src Pulse Resp SpO2  08/26/24 0853 (P) 118/80 -- -- (P) 81 -- --  08/26/24 0527 116/88 98.2 F (36.8 C) Oral 88 18 98 %  08/25/24 2113 115/71 -- -- 88 18 98 %  08/25/24 1618 120/83 98.2 F (36.8 C) Oral -- 20 --   NAD Lungs CTA CV RRR DTR +1,  no swelling  Lasix  20mg  po daily 5 days due to h/o PP PEC  Plan repeat BP at 12 noon and if nl, plan D/c home  Labs and BP check in office on 1/5  Warning ss dw pt  Cracked nipple, NAPN cream sent to [facility] pharmacy  Pt agrees

## 2024-08-26 NOTE — Lactation Note (Signed)
 This note was copied from a baby's chart. Lactation Consultation Note  Patient Name: Maria Murray Unijb'd Date: 08/26/2024 Age:38 hours Reason for consult: Follow-up assessment;Infant weight loss;Early term 37-38.6wks  P2, 8.2% weight loss.  1.2% in the last 24 hours. Mother is post pumping 11-17 ml after every feeding and giving to baby.  Fitted mother for flanges by using manual pump.  18 mm flanges seem appropriate at this time.  Copious transitional breastmilk observed with pumping.  Provided flange insert information for home pump.  Observed breastfeeding on both breasts with frequent swallows.  Encouraged offering both breasts equally although mother feels there L breast produces more breastmilk.  Provided education.  Baby latched deep on breast. Discussed pointing nipple to the room of baby's mouth.  Mother recently pumped 11 ml and will give to baby after feeding. Due to milk volume pump, suggest post pumping every other feeding and giving to baby.  Burp half way through each bottle. Reviewed engorgement care and monitoring voids/stools.   Maternal Data Has patient been taught Hand Expression?: Yes Does the patient have breastfeeding experience prior to this delivery?: Yes  Feeding Mother's Current Feeding Choice: Breast Milk Lactation Tools Discussed/Used Tools: Pump;Flanges Flange Size: 18 Breast pump type: Double-Electric Breast Pump;Manual Pumping frequency: every other feeding Pumped volume: 17 mL  Interventions Interventions: Breast feeding basics reviewed;Assisted with latch;DEBP;Education  Discharge Discharge Education: Engorgement and breast care;Warning signs for feeding baby Pump: Personal;DEBP (Spectra )  Consult Status Consult Status: Complete Date: 08/26/24    Maria Murray [facility] 08/26/2024, 11:53 AM

## 2024-08-26 NOTE — Progress Notes (Signed)
" ° °  PPD #2 S/P NSVD  Live born female  Birth Weight: 7 lb 8.3 oz (3410 g) APGAR: 8, 10  Newborn Delivery   Birth date/time: 08/24/2024 05:05:16 Delivery type: Vaginal, Spontaneous     Delivering provider: KANDYCE SOR  Lacerations: 2nd degree  Feeding: breast  Pain control at delivery: None  S:  Reports feeling well, desiring d/c home today if pending CMP is nml. Slight HA/pressure in forehead overnight, attributes this time lack of sleep.              Tolerating PO/No nausea or vomiting             Bleeding is light.             Pain controlled with acetaminophen  and ibuprofen  (OTC)             Up ad lib/ambulatory/voiding without difficulties, BM w/o complaint.   O:  A & O x 3, in no apparent distress              VS:  Vitals:   08/25/24 0531 08/25/24 1618 08/25/24 2113 08/26/24 0527  BP: 120/82 120/83 115/71 116/88  Pulse:   88 88  Resp: 18 20 18 18   Temp: 97.6 F (36.4 C) 98.2 F (36.8 C)  98.2 F (36.8 C)  TempSrc: Oral Oral  Oral  SpO2: 100%  98% 98%  Weight:        LABS:  Recent Labs    08/24/24 0250 08/25/24 0517  WBC 9.1 12.7*  HGB 13.6 12.2  HCT 40.4 36.6  PLT 202 162    Blood type: --/--/O POS (01/01 0250)  Rubella: Immune (06/18 0000)   I&O: No intake/output data recorded.          No intake/output data recorded.  Gen: AAO x 3, NAD Abdomen: soft, non-tender, non-distended Fundus: firm, non-tender, U-1. Perineum: Appropriately tender Lochia: Minimal Extremities: No edema, no calf pain or tenderness   A/P:  PPD # 2 38 y.o., [id number]   Principal Problem:   Normal labor    Doing well - stable status PPD2 s/p NSVD.   H/o of PP PEC with G1 at >24hr pptm, stayed overnight for monitoring. BP's 120/80's. To take BP at home BID. Precautions given, HA, vision change, swelling, RUQ pain. CMP 1/3 pending, to result prior to discharge.   Routine post partum orders.  PP precautions given, bleeding, pain, infection/fever, PPD and PP-PEC. Pt tp take  BP at home BID and call with concern. Early PP f/u in office for BP check next week.   Anticipate discharge today, infant bili being monitored. Will stay as indicated.    Sherrilee KANDICE Both, MSN, CNM 08/26/2024, 6:31 AM     "

## 2024-08-26 NOTE — Progress Notes (Signed)
 HA overnight, h/o PP PEC with rpt CMP showing elevated liver enzymes from yesterday to today. BP nml, no change in vision or RUQ pain at this time, swell was mild upon PE this AM. Pt to stay for observation and treatment as indicated. Dr. Barbette notified of CMP results, to round on pt this morning.  VS:  Temp:  [98.2 F (36.8 C)] 98.2 F (36.8 C) (01/03 0527) Pulse Rate:  [81-88] (P) 81 (01/03 0853) Resp:  [18-20] 18 (01/03 0527) BP: (115-120)/(71-88) (P) 118/80 (01/03 0853) SpO2:  [98 %] 98 % (01/03 0527)       Latest Ref Rng & Units 08/26/2024    7:07 AM 08/25/2024    5:17 AM 08/24/2024    2:50 AM  CMP  Glucose 70 - 99 mg/dL 74  75  98   BUN 6 - 20 mg/dL 12  11  13    Creatinine 0.44 - 1.00 mg/dL 9.24  9.22  9.29   Sodium 135 - 145 mmol/L 137  138  135   Potassium 3.5 - 5.1 mmol/L 4.2  3.9  4.0   Chloride 98 - 111 mmol/L 104  105  103   CO2 22 - 32 mmol/L 25  25  20    Calcium  8.9 - 10.3 mg/dL 8.6  8.8  9.4   Total Protein 6.5 - 8.1 g/dL 5.5  5.6  6.7   Total Bilirubin 0.0 - 1.2 mg/dL <9.7  0.2  0.3   Alkaline Phos 38 - 126 U/L 125  145  193   AST 15 - 41 U/L 47  35  36   ALT 0 - 44 U/L 60  24  27

## 2024-09-02 ENCOUNTER — Telehealth (HOSPITAL_COMMUNITY): Payer: Self-pay | Admitting: *Deleted

## 2024-09-02 NOTE — Telephone Encounter (Signed)
 Attempted hospital discharge follow-up call. Left message for patient to return RN call with any questions or concerns. Allean IVAR Carton, RN, 09/02/24, [phone]
